# Patient Record
Sex: Male | Born: 1950 | Race: Black or African American | Hispanic: No | Smoking: Former smoker
Health system: Southern US, Community
[De-identification: ages and names within clinical notes are randomized; demographics above are authoritative.]

## PROBLEM LIST (undated history)

## (undated) DIAGNOSIS — E119 Type 2 diabetes mellitus without complications: Secondary | ICD-10-CM

## (undated) DIAGNOSIS — K759 Inflammatory liver disease, unspecified: Secondary | ICD-10-CM

## (undated) DIAGNOSIS — I10 Essential (primary) hypertension: Secondary | ICD-10-CM

## (undated) DIAGNOSIS — T148XXA Other injury of unspecified body region, initial encounter: Secondary | ICD-10-CM

## (undated) DIAGNOSIS — G4730 Sleep apnea, unspecified: Secondary | ICD-10-CM

## (undated) HISTORY — DX: Type 2 diabetes mellitus without complications: E11.9

## (undated) HISTORY — DX: Essential (primary) hypertension: I10

## (undated) HISTORY — DX: Inflammatory liver disease, unspecified: K75.9

## (undated) HISTORY — DX: Other injury of unspecified body region, initial encounter: T14.8XXA

## (undated) HISTORY — DX: Sleep apnea, unspecified: G47.30

---

## 1975-01-31 HISTORY — PX: KNEE SURGERY: SHX244

## 1980-01-31 HISTORY — PX: ABDOMINAL SURGERY: SHX537

## 2003-04-20 ENCOUNTER — Emergency Department (HOSPITAL_COMMUNITY): Admission: EM | Admit: 2003-04-20 | Discharge: 2003-04-20 | Payer: Self-pay | Admitting: Emergency Medicine

## 2006-04-19 ENCOUNTER — Ambulatory Visit (HOSPITAL_COMMUNITY): Admission: RE | Admit: 2006-04-19 | Discharge: 2006-04-19 | Payer: Self-pay | Admitting: Family Medicine

## 2006-10-05 ENCOUNTER — Ambulatory Visit (HOSPITAL_COMMUNITY): Admission: RE | Admit: 2006-10-05 | Discharge: 2006-10-05 | Payer: Self-pay | Admitting: Neurosurgery

## 2006-10-17 ENCOUNTER — Encounter: Admission: RE | Admit: 2006-10-17 | Discharge: 2006-10-17 | Payer: Self-pay | Admitting: Neurosurgery

## 2006-11-09 ENCOUNTER — Inpatient Hospital Stay (HOSPITAL_COMMUNITY): Admission: RE | Admit: 2006-11-09 | Discharge: 2006-11-13 | Payer: Self-pay | Admitting: Neurosurgery

## 2007-06-13 ENCOUNTER — Ambulatory Visit (HOSPITAL_COMMUNITY): Admission: RE | Admit: 2007-06-13 | Discharge: 2007-06-13 | Payer: Self-pay | Admitting: Family Medicine

## 2007-06-27 ENCOUNTER — Ambulatory Visit (HOSPITAL_COMMUNITY): Admission: RE | Admit: 2007-06-27 | Discharge: 2007-06-27 | Payer: Self-pay | Admitting: Family Medicine

## 2007-11-28 ENCOUNTER — Ambulatory Visit (HOSPITAL_COMMUNITY): Admission: RE | Admit: 2007-11-28 | Discharge: 2007-11-28 | Payer: Self-pay | Admitting: Family Medicine

## 2008-01-31 HISTORY — PX: CERVICAL FUSION: SHX112

## 2010-06-14 NOTE — Discharge Summary (Signed)
Jason Oliver, Jason Oliver            ACCOUNT NO.:  [id number]   MEDICAL RECORD NO.:  [id number]          PATIENT TYPE:  INP   LOCATION:  3035                         FACILITY:  [facility]   PHYSICIAN:  Payton Doughty, M.D.      DATE OF BIRTH:  11/11/1950   DATE OF ADMISSION:  11/09/2006  DATE OF DISCHARGE:  11/13/2006                               DISCHARGE SUMMARY   ADMITTING DIAGNOSIS:  C2 os odontoideum with C1-C2 instability.   DISCHARGE DIAGNOSIS:  C2 os odontoideum with C1-C2 instability.   PROCEDURES:  C1-C2 posterior cervical fusion.   ATTENDING:  Dr. Venetia Maxon   COMPLICATIONS:  None.   DISCHARGE STATUS:  Alive and well.   This is a 60 year old, right-handed black gentleman whose history and  physical is recounted in the chart.  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  He was admitted after ascertaining normal  laboratory values, underwent posterior fusion with Songer cable and  posterolateral arthrodesis at C1-2.  Postoperatively, he has done well.  He has had some drainage from his neck which is diminishing.  He had one  episode of fever which is now resolved.  His strength is full, he is  __________ with his activities of daily living and he knows to wear his  SOMI brace.  He is being discharged home in the care of his family with  Percocet for pain and ciprofloxacin for skin coverage.           ______________________________  Payton Doughty, M.D.     MWR/MEDQ  D:  11/13/2006  T:  11/13/2006  Job:  [id number]

## 2010-06-14 NOTE — Op Note (Signed)
NAMETILDON, SILVERIA            ACCOUNT NO.:  [id number]   MEDICAL RECORD NO.:  [id number]          PATIENT TYPE:  INP   LOCATION:  3035                         FACILITY:  [facility]   PHYSICIAN:  Danae Orleans. Venetia Maxon, M.D.  DATE OF BIRTH:  1950/08/21   DATE OF PROCEDURE:  11/09/2006  DATE OF DISCHARGE:                               OPERATIVE REPORT   PREOPERATIVE DIAGNOSIS:  C2 fracture with C1-2 instability with spinal  cord injury and cervical myelopathy.   POSTOPERATIVE DIAGNOSIS:  C2 fracture with C1-2 instability with spinal  cord injury and cervical myelopathy.   PROCEDURE:  1. C1-2 posterior cervical fusion with bone allograft and Songer      cable.  2. Posterolateral arthrodesis, C1 through C2, using bone morphogenic      protein and bone graft extender.   SURGEON:  Danae Orleans. Venetia Maxon, M.D.   ASSISTANTS:  1. Payton Doughty, M.D.  2. Georgiann Cocker, R.N.   ANESTHESIA:  General endotracheal anesthesia.   ESTIMATED BLOOD LOSS:  Minimal.   COMPLICATIONS:  None.   DISPOSITION:  To Recovery.   INDICATIONS:  Abdur-Rahim Basu is a 60 year old man with a severe  cervical myelopathy with os odontoideum and C1-2 instability.  It was  elected to take him to surgery for posterior cervical fusion at C1-2.   PROCEDURE:  Mr. Illescas was brought to the operating room.  He was  intubated with his neck in neutral alignment using a glide scope and  then placed in a cervical collar and 3-pin fixation and turned carefully  onto a prone position on the operating table.  His neck was maintained  in a slightly extended alignment and immediately upon turning and  positioning him, a fluoroscopic radiograph was obtained, which  demonstrated that he had good alignment of his C1-2 junction without  anterior translation of C1.  Then his neck was then prepped and draped  in the usual sterile fashion.  The patient has an extremely large neck.  The area of planned incision was infiltrated with 0.25% Marcaine,  0.5%  lidocaine and 1:200,000 epinephrine.  Incision was made in the posterior  neck from the occiput to approximately the level of C5 and carried  through adipose tissue to expose the C1-2 interspace and the C2 spinous  process.  Using cerebellar retractors and staying in the avascular  midline plane, the laminae of C2 were widely exposed, as was the ring of  C1.  Using very careful dissection, the dura was exposed both above and  below the ring of C1 and using a Woodson elevator, the interval between  the ring of C1 and the dura was established.  Subsequently, an 0 Prolene  suture was placed and then the single arm Songer cable was brought  through under the ring of C1.  A patellar allograft wedge was reshaped  so to approximate the ring of C1 and also to hook around the upper  aspect of C2 and prior to placing the Songer cable, the ring of C1 was  extensively decorticated, as was the laminae and spinous  process/anterior aspect of C2.  After the patellar  graft had been  fashioned, it was placed and the Songer cable was looped around C2 and  locked into position after tensioning up to 20 inch-pounds.  The graft  appeared to be well positioned and had good approximation on C2.  Subsequently, the radiograph showed well aligned cervical spine and  positioning of the graft and cable.  Overlying the C1-2 facet joint,  bone was then exposed and the dorsal bone was then decorticated and a  medium kit of BMP was used with 10 mL Mastergraft Matrix and this was  placed over the dorsolateral bone and facet joints.  The self-retaining  retractor was removed.  Prior to placing the BMP, the wound had been  irrigated.  The muscles were then reapproximated with 0 Vicryl sutures,  then the fascia reapproximated with 0 Vicryl sutures, the skin edges  were approximated with 2-0 Vicryl interrupted inverted sutures and then  staples.  A sterile occlusive dressing was placed.  The patient was  placed back  into an Aspen collar and returned to the supine position on  the OR stretcher.  He was taken out of 3-pin head fixation and taken to  Recovery, having tolerated the procedure well.      Danae Orleans. Venetia Maxon, M.D.  Electronically Signed     JDS/MEDQ  D:  11/09/2006  T:  11/10/2006  Job:  [id number]

## 2010-11-10 LAB — COMPREHENSIVE METABOLIC PANEL
AST: 27
CO2: 30
Chloride: 103
Creatinine, Ser: 0.89
GFR calc Af Amer: >60
GFR calc non Af Amer: >60
Potassium: 4.8
Sodium: 140
Total Bilirubin: 0.9
Total Protein: 7.1

## 2010-11-10 LAB — CBC
Platelets: 256
WBC: 5

## 2016-06-12 LAB — HEPATITIS B SURFACE ANTIGEN: Hepatitis B Surface Ag: NONREACTIVE

## 2016-06-13 LAB — HM HEPATITIS C SCREENING LAB: HM Hepatitis Screen: NEGATIVE

## 2017-05-22 ENCOUNTER — Other Ambulatory Visit (HOSPITAL_COMMUNITY): Payer: Self-pay | Admitting: Family Medicine

## 2017-05-22 ENCOUNTER — Ambulatory Visit (HOSPITAL_COMMUNITY)
Admission: RE | Admit: 2017-05-22 | Discharge: 2017-05-22 | Disposition: A | Discharge status: Home / Self Care | Payer: Medicare Other | Source: Ambulatory Visit | Attending: Family Medicine | Admitting: Family Medicine

## 2017-05-22 DIAGNOSIS — R2689 Other abnormalities of gait and mobility: Secondary | ICD-10-CM | POA: Insufficient documentation

## 2017-05-22 DIAGNOSIS — M25462 Effusion, left knee: Secondary | ICD-10-CM | POA: Insufficient documentation

## 2017-05-22 DIAGNOSIS — M170 Bilateral primary osteoarthritis of knee: Secondary | ICD-10-CM | POA: Diagnosis not present

## 2017-07-04 NOTE — Congregational Nurse Program (Cosign Needed)
Client request BP today, is on medication but states doesn't take as prescribed.  Encouraged client important to take as prescribed to keep BP under control. Client has a PCP. Pearletha AlfredJan Johara Lodwick,RN  [phone].

## 2017-09-06 ENCOUNTER — Ambulatory Visit (INDEPENDENT_AMBULATORY_CARE_PROVIDER_SITE_OTHER): Payer: Medicare Other | Admitting: Orthopaedic Surgery

## 2017-09-06 ENCOUNTER — Encounter: Payer: Self-pay | Admitting: Orthopaedic Surgery

## 2017-09-06 VITALS — BP 130/80 | HR 70 | Ht 64.0 in | Wt 252.0 lb

## 2017-09-06 DIAGNOSIS — G8929 Other chronic pain: Secondary | ICD-10-CM | POA: Diagnosis not present

## 2017-09-06 DIAGNOSIS — M25561 Pain in right knee: Secondary | ICD-10-CM

## 2017-09-06 DIAGNOSIS — Z6841 Body Mass Index (BMI) 40.0 and over, adult: Secondary | ICD-10-CM

## 2017-09-06 DIAGNOSIS — M25562 Pain in left knee: Secondary | ICD-10-CM | POA: Diagnosis not present
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
## 2018-02-26 ENCOUNTER — Other Ambulatory Visit (HOSPITAL_BASED_OUTPATIENT_CLINIC_OR_DEPARTMENT_OTHER): Payer: Self-pay

## 2018-02-26 DIAGNOSIS — G4733 Obstructive sleep apnea (adult) (pediatric): Secondary | ICD-10-CM

## 2018-02-27 ENCOUNTER — Ambulatory Visit: Payer: Medicare Other | Attending: Neurology | Admitting: Neurology

## 2018-02-27 DIAGNOSIS — G4733 Obstructive sleep apnea (adult) (pediatric): Secondary | ICD-10-CM | POA: Diagnosis present

## 2018-03-13 NOTE — Procedures (Signed)
[place] A. Merlene Laughter, MD     [email or web address]             NOCTURNAL POLYSOMNOGRAPHY   LOCATION: [facility]  Patient Name: Jason Oliver, Jason Oliver Date: 02/27/2018 Gender: Male D.O.B: [birth year] Age (years): 68 Referring Provider: Barton Fanny NP Height (inches): 66 Interpreting Physician: Phillips Odor MD, ABSM Weight (lbs): 266 RPSGT: Peak, Robert BMI: 43 MRN: [id number] Neck Size: 20.00 CLINICAL INFORMATION The patient is referred for a split night study with BPAP.  MEDICATIONS Medications self-administered by patient taken the night of the study : N/A  Current Outpatient Medications:  .  amLODipine (NORVASC) 10 MG tablet, Take 10 mg by mouth daily., Disp: , Rfl:  .  Cholecalciferol (VITAMIN D3 PO), Take 5,000 Units by mouth daily., Disp: , Rfl:  .  olmesartan (BENICAR) 20 MG tablet, Take 10 mg by mouth daily., Disp: , Rfl:   SLEEP STUDY TECHNIQUE As per the AASM Manual for the Scoring of Sleep and Associated Events v2.3 (April 2016) with a hypopnea requiring 4% desaturations.  The channels recorded and monitored were frontal, central and occipital EEG, electrooculogram (EOG), submentalis EMG (chin), nasal and oral airflow, thoracic and abdominal wall motion, anterior tibialis EMG, snore microphone, electrocardiogram, and pulse oximetry. Bi-level positive airway pressure (BiPAP) was initiated when the patient met split night criteria and was titrated according to treat sleep-disordered breathing.  RESPIRATORY PARAMETERS Diagnostic  Total AHI (/hr): 62.6 RDI (/hr): 76.9 OA Index (/hr): 3.8 CA Index (/hr): 3.8 REM AHI (/hr): 90.0 NREM AHI (/hr): 60.8 Supine AHI (/hr): 70.0 Non-supine AHI (/hr): 27.69 Min O2 Sat (%): 79.0 Mean O2 (%): 93.0 Time below 88% (min): 5.3   Titration  Optimal IPAP Pressure (cm):  Optimal EPAP Pressure (cm):  AHI at Optimal Pressure (/hr): N/A Min O2 at Optimal Pressure (%): 89.0 Sleep % at Optimal (%): N/A Supine % at  Optimal (%): N/A   The patient was titrated on conventional CPAP between pressures of 5 to 14 unsuccessfully.  This resulted in the emergence of severe central sleep apnea syndrome also unresponsive to bilevel pressures.  Pressures tried were 12/8 to 16/12.     The diagnostic obstructive AHI is 58.4 and central 4.1 but with positive pressure treatment and the development of central sleep apnea syndrome the treatment  AHI for obstructive is 22 and central 48. The total events combined for  Obstructive events are 234 and central events  175.     SLEEP ARCHITECTURE The study was initiated at 9:03:26 PM and terminated at 4:35:26 AM. The total recorded time was 452 minutes. EEG confirmed total sleep time was 364.8 minutes yielding a sleep efficiency of 80.7%%. Sleep onset after lights out was 7.7 minutes with a REM latency of 156.0 minutes. The patient spent 24.5%% of the night in stage N1 sleep, 62.2%% in stage N2 sleep, 0.0%% in stage N3 and 13.3% in REM. Wake after sleep onset (WASO) was 79.5 minutes. The Arousal Index was 15.0/hour.     LEG MOVEMENT DATA The total Periodic Limb Movements of Sleep (PLMS) were 0. The PLMS index was 0.0 .  CARDIAC DATA The 2 lead EKG demonstrated sinus rhythm. The mean heart rate was 100.0 beats per minute. Other EKG findings include: None.  IMPRESSIONS 1. Severe obstructive sleep apnea occurred during the diagnostic portion of the study (AHI = 62.6 /hour). An optimal CPAP/BPAP pressure could not be selected for this patient based on the available study data.  Treatment with positive pressure resulted in  the emergence of severe central sleep apnea.  A formal positive titration study is recommended with the likely need for Servo ventilation.    Delano Metz, MD Diplomate, American Board of Sleep Medicine. ELECTRONICALLY SIGNED ON:  03/13/2018, 11:04 AM [facility] PH: [phone]   FX: [phone] [place]

## 2018-04-04 ENCOUNTER — Other Ambulatory Visit (HOSPITAL_BASED_OUTPATIENT_CLINIC_OR_DEPARTMENT_OTHER): Payer: Self-pay

## 2018-04-04 DIAGNOSIS — G4733 Obstructive sleep apnea (adult) (pediatric): Secondary | ICD-10-CM

## 2018-04-12 ENCOUNTER — Other Ambulatory Visit: Payer: Self-pay

## 2018-04-12 ENCOUNTER — Ambulatory Visit: Payer: Medicare Other | Attending: Neurology | Admitting: Neurology

## 2018-04-12 DIAGNOSIS — Z79899 Other long term (current) drug therapy: Secondary | ICD-10-CM | POA: Insufficient documentation

## 2018-04-12 DIAGNOSIS — G4733 Obstructive sleep apnea (adult) (pediatric): Secondary | ICD-10-CM | POA: Insufficient documentation

## 2018-04-16 NOTE — Procedures (Signed)
  [facility] Antuan Limes A. Gerilyn Pilgrim, MD     [email or web address]             NOCTURNAL POLYSOMNOGRAPHY   LOCATION: [facility]   Patient Name: Jason Oliver, Signs Date: 04/12/2018 Gender: Male D.O.B: 1950/02/13 Age (years): 44 Referring Provider: Jorge Mandril NP Height (inches): 66 Interpreting Physician: Beryle Beams MD, ABSM Weight (lbs): 266 RPSGT: Peak, Robert BMI: 43 MRN: [id number] Neck Size: 20.00 CLINICAL INFORMATION The patient is referred for a BiPAP titration to treat sleep apnea.  Date of NPSG, Split Night or HST:  SLEEP STUDY TECHNIQUE As per the AASM Manual for the Scoring of Sleep and Associated Events v2.3 (April 2016) with a hypopnea requiring 4% desaturations.  The channels recorded and monitored were frontal, central and occipital EEG, electrooculogram (EOG), submentalis EMG (chin), nasal and oral airflow, thoracic and abdominal wall motion, anterior tibialis EMG, snore microphone, electrocardiogram, and pulse oximetry. Bilevel positive airway pressure (BPAP) was initiated at the beginning of the study and titrated to treat sleep-disordered breathing.  MEDICATIONS Medications self-administered by patient taken the night of the study : N/A  Current Outpatient Medications:  .  amLODipine (NORVASC) 10 MG tablet, Take 10 mg by mouth daily., Disp: , Rfl:  .  Cholecalciferol (VITAMIN D3 PO), Take 5,000 Units by mouth daily., Disp: , Rfl:  .  olmesartan (BENICAR) 20 MG tablet, Take 10 mg by mouth daily., Disp: , Rfl:    RESPIRATORY PARAMETERS Optimal IPAP Pressure (cm): 14 AHI at Optimal Pressure (/hr) 6.5 Optimal EPAP Pressure (cm): 9   Overall Minimal O2 (%): 85.0 Minimal O2 at Optimal Pressure (%): 92.0  The patient was titrated on bilevel pressures between 8/4 up to 14/9.  The optimal pressure is a 14/9.   SLEEP ARCHITECTURE Start Time: 10:42:03 PM Stop Time: 5:03:15 AM Total Time (min): 381.2 Total Sleep Time (min): 261.4 Sleep Latency (min):  6.3 Sleep Efficiency (%): 68.6% REM Latency (min): 168.0 WASO (min): 113.5 Stage N1 (%): 26.8% Stage N2 (%): 62.1% Stage N3 (%): 0.0% Stage R (%): 11.1 Supine (%): 69.37 Arousal Index (/hr): 22.7      CARDIAC DATA The 2 lead EKG demonstrated sinus rhythm. The mean heart rate was 59.6 beats per minute. Other EKG findings include: None.  LEG MOVEMENT DATA The total Periodic Limb Movements of Sleep (PLMS) were 0. The PLMS index was 0.0. A PLMS index of <15 is considered normal in adults.  IMPRESSIONS The optimal BIPAP selected for this patient are ( 14 / 9cm of water).  Argie Ramming, MD Diplomate, American Board of Sleep Medicine.  ELECTRONICALLY SIGNED ON:  04/16/2018, 6:07 PM [facility] PH: [phone]   FX: [phone] ACCREDITED BY THE AMERICAN ACADEMY OF SLEEP MEDICINE

## 2018-05-26 ENCOUNTER — Encounter: Payer: Self-pay | Admitting: Orthopaedic Surgery

## 2019-01-27 IMAGING — DX DG KNEE COMPLETE 4+V*L*
4 series · 4 of 4 positions shown · non-contrast
Comparison: 11/28/2007

CLINICAL DATA: Bilateral knee discomfort.

EXAM:
LEFT KNEE - COMPLETE 4+ VIEW

[knee ap]
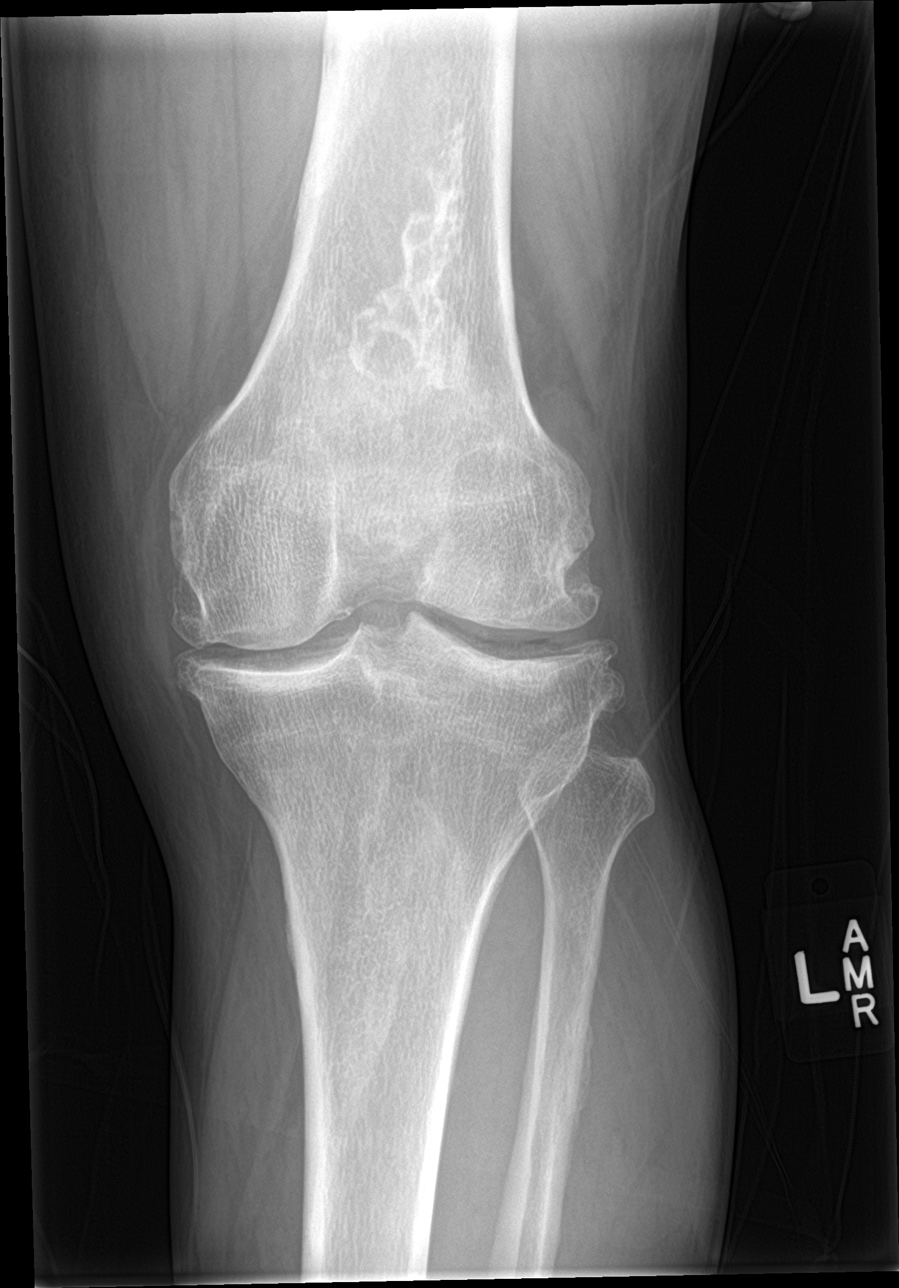

[tunnel]
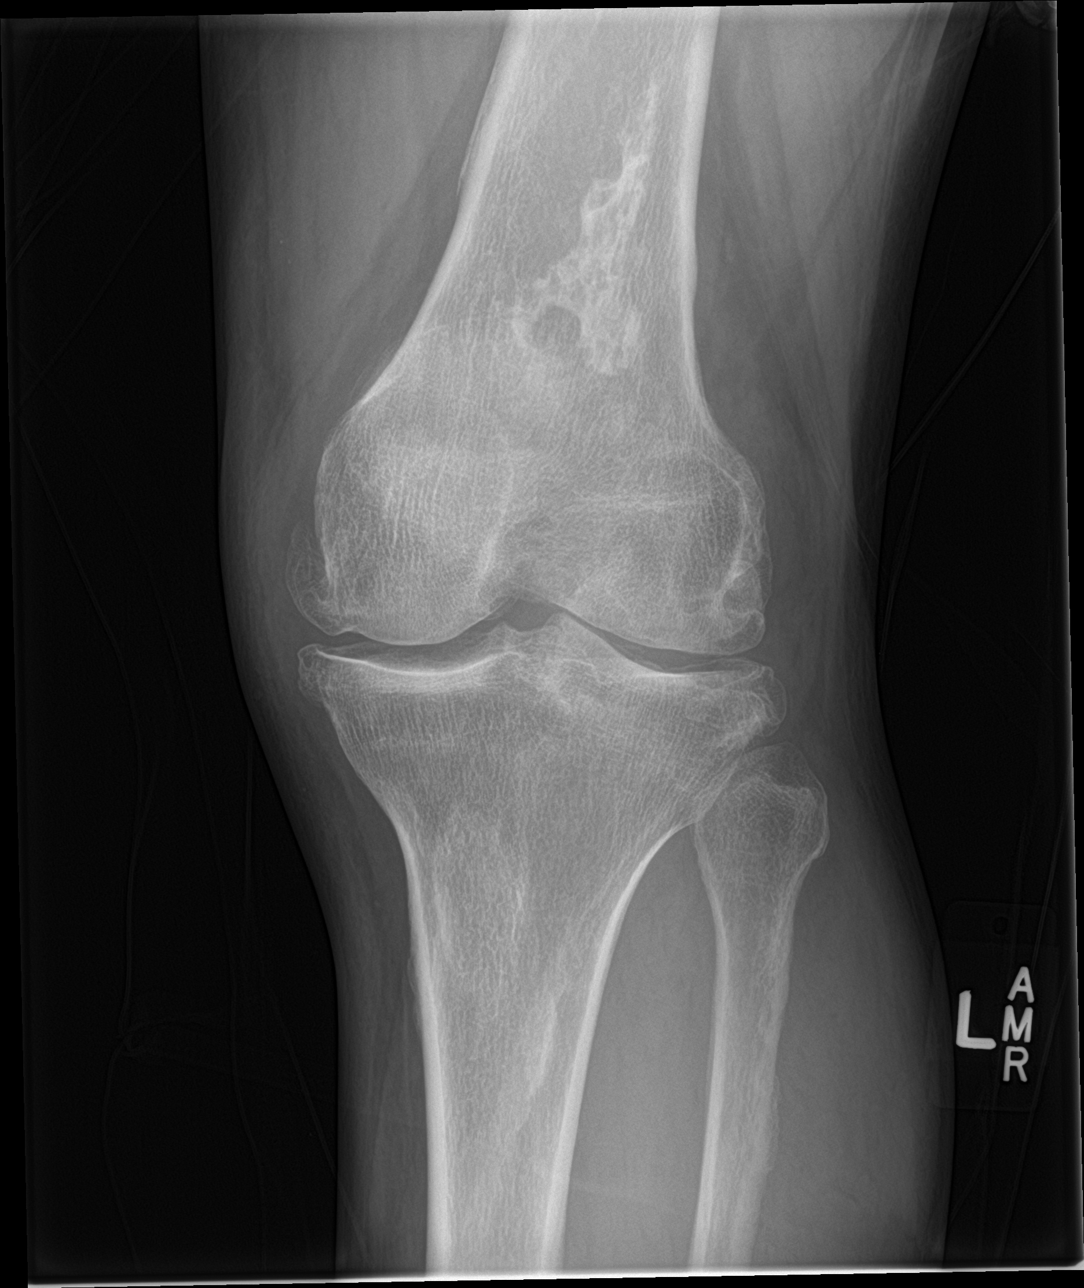

[knee lat]
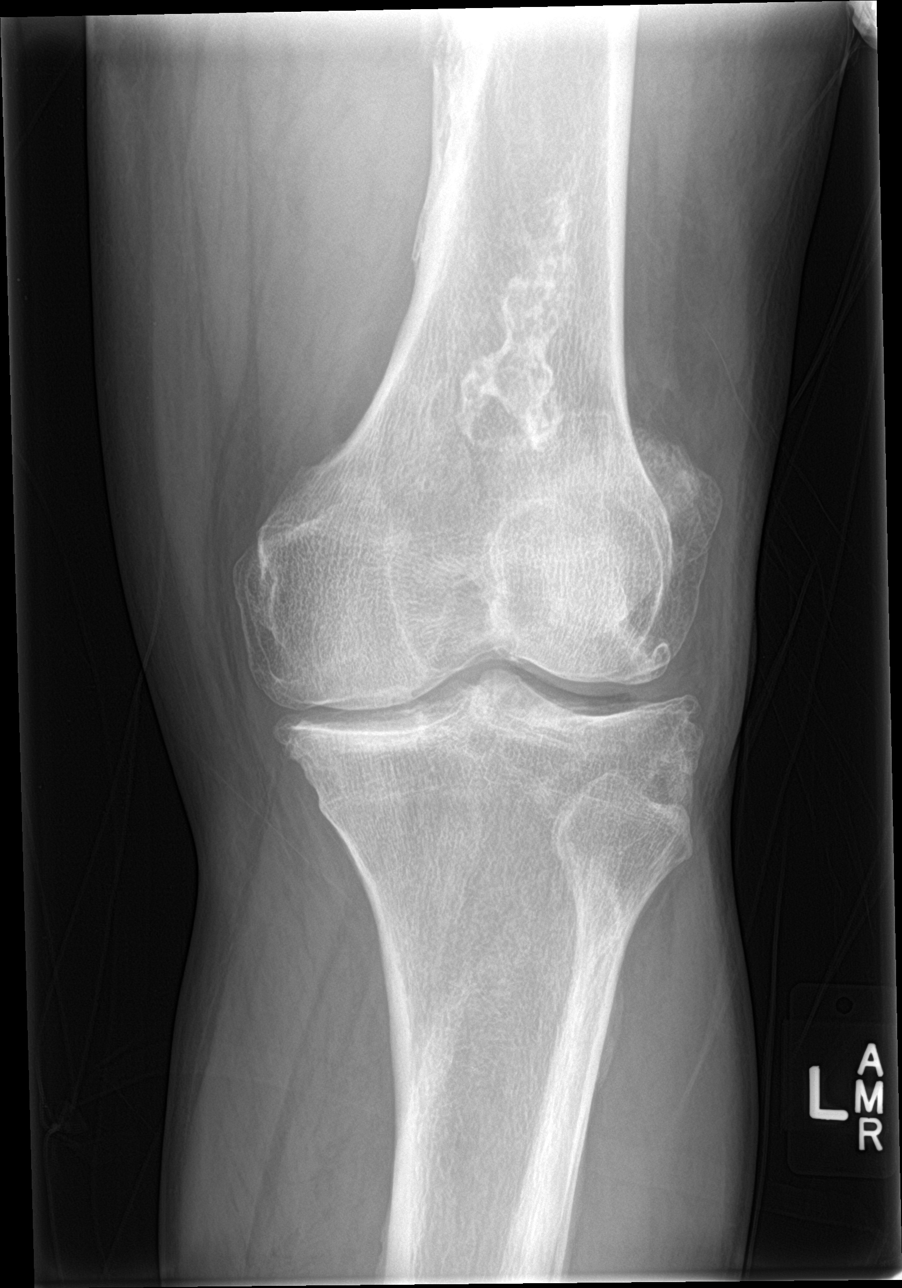

[knee sunrise]
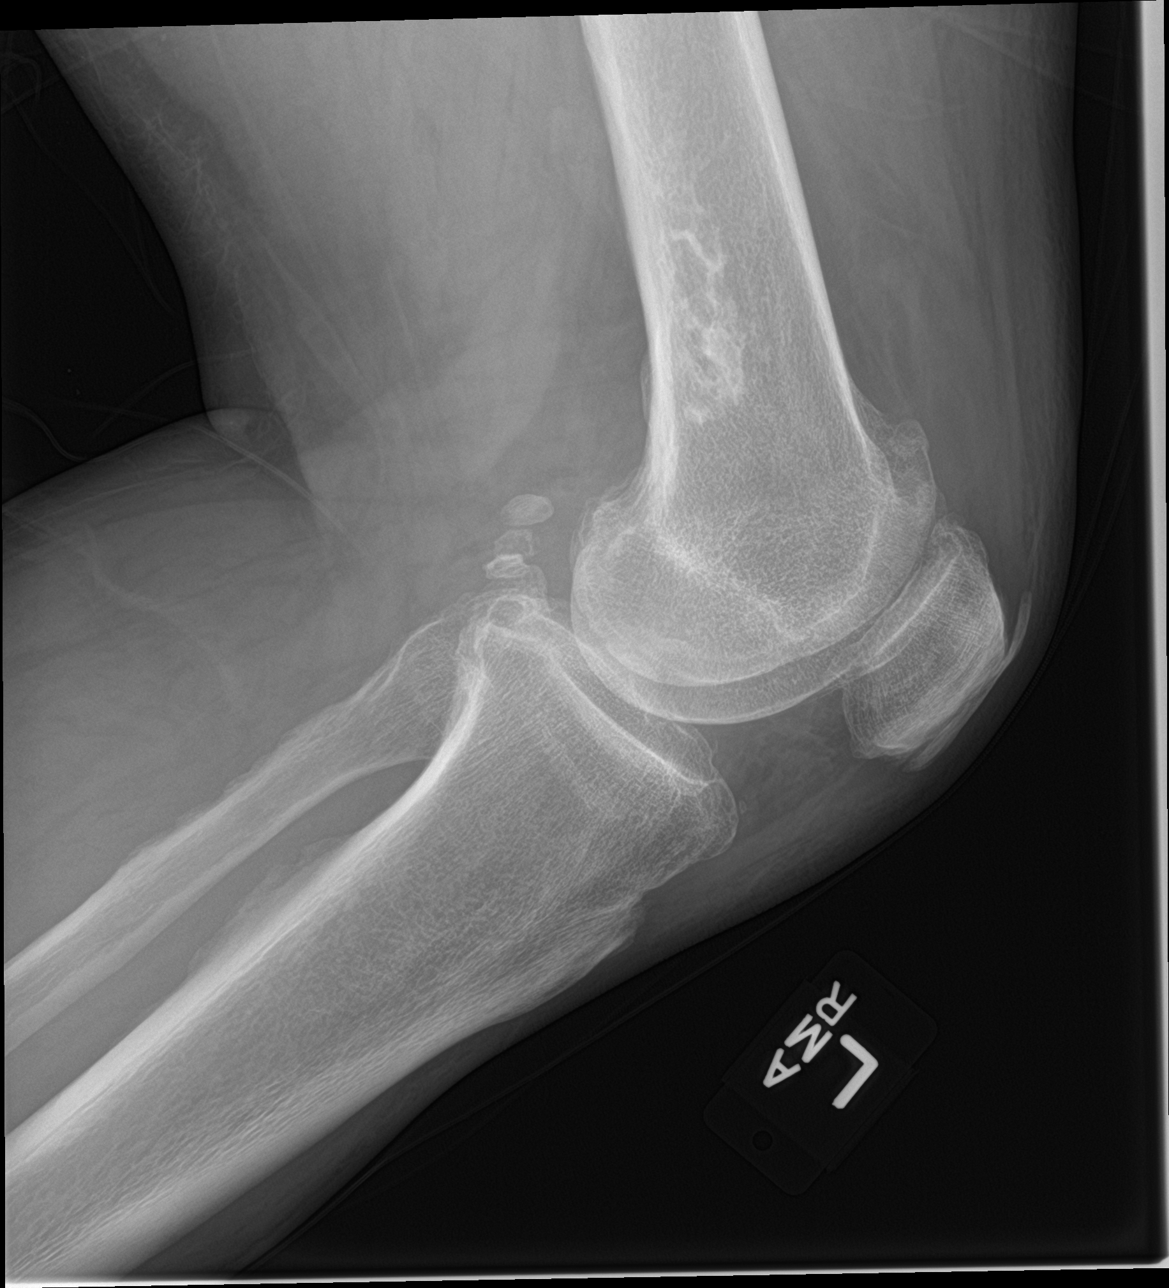

[4 of 4 positions shown; findings below may reference images not displayed]

FINDINGS: Chronic tricompartmental osteoarthritis with joint space narrowing
and marginal osteophytes, slightly progressive over the last 10
years. Moderate size knee joint effusion. Intra-articular loose
bodies, more numerous than seen previously. Chronic medullary bone
infarction of the distal femur and old traumatic changes of the
femoral diaphysis.
IMPRESSION: Chronic tricompartmental osteoarthritis, worsening over time, with a
moderate effusion and more loose bodies.

## 2019-10-15 LAB — LIPID PANEL
Cholesterol: 179 (ref 0–200)
HDL: 35 (ref 35–70)
LDL Cholesterol: 119
LDl/HDL Ratio: 5.1
Triglycerides: 135 (ref 40–160)

## 2019-10-15 LAB — HEPATIC FUNCTION PANEL
ALT: 72 — AB (ref 10–40)
AST: 33 (ref 14–40)
Alkaline Phosphatase: 57 (ref 25–125)
Bilirubin, Total: 0.6

## 2019-10-15 LAB — CBC AND DIFFERENTIAL
HCT: 40 — AB (ref 41–53)
Hemoglobin: 13.6 (ref 13.5–17.5)
Platelets: 214 (ref 150–399)
WBC: 4.8

## 2019-10-15 LAB — COMPREHENSIVE METABOLIC PANEL
Albumin: 4.5 (ref 3.5–5.0)
Calcium: 9.3 (ref 8.7–10.7)
GFR calc Af Amer: 97
Globulin: 2.8

## 2019-10-15 LAB — TSH: TSH: 1.67 (ref 0.41–5.90)

## 2019-10-15 LAB — BASIC METABOLIC PANEL
BUN: 10 (ref 4–21)
CO2: 28 — AB (ref 13–22)
Chloride: 105 (ref 99–108)
Creatinine: 0.9 (ref 0.6–1.3)
Glucose: 165
Potassium: 4.3 (ref 3.4–5.3)
Sodium: 138 (ref 137–147)

## 2019-10-15 LAB — CBC: RBC: 4.28 (ref 3.87–5.11)

## 2019-10-15 LAB — PSA: PSA: 1.53

## 2020-08-04 ENCOUNTER — Telehealth (INDEPENDENT_AMBULATORY_CARE_PROVIDER_SITE_OTHER): Payer: Self-pay

## 2020-08-04 NOTE — Telephone Encounter (Signed)
Called patient and scheduled him on 09/15/2020 at 1pm. Patient verbalized an understanding.

## 2020-08-04 NOTE — Telephone Encounter (Signed)
Patient called and is requesting a new patient appointment since Dr. Janna Arch is retiring. Patient denies chronic pain medication.  Please advise if OK to schedule for new patient appointment.

## 2020-08-16 LAB — HM DIABETES EYE EXAM

## 2020-09-15 ENCOUNTER — Ambulatory Visit (INDEPENDENT_AMBULATORY_CARE_PROVIDER_SITE_OTHER): Payer: Medicare Other | Admitting: Nurse Practitioner

## 2020-12-17 ENCOUNTER — Encounter (HOSPITAL_COMMUNITY): Payer: Self-pay | Admitting: *Deleted

## 2020-12-17 ENCOUNTER — Emergency Department (HOSPITAL_COMMUNITY)
Admission: EM | Admit: 2020-12-17 | Discharge: 2020-12-17 | Disposition: A | Discharge status: Home / Self Care | Payer: Medicare Other | Attending: Emergency Medicine | Admitting: Emergency Medicine

## 2020-12-17 ENCOUNTER — Other Ambulatory Visit: Payer: Self-pay

## 2020-12-17 DIAGNOSIS — I10 Essential (primary) hypertension: Secondary | ICD-10-CM | POA: Insufficient documentation

## 2020-12-17 DIAGNOSIS — Z79899 Other long term (current) drug therapy: Secondary | ICD-10-CM | POA: Insufficient documentation

## 2020-12-17 DIAGNOSIS — R350 Frequency of micturition: Secondary | ICD-10-CM | POA: Diagnosis not present

## 2020-12-17 DIAGNOSIS — R739 Hyperglycemia, unspecified: Secondary | ICD-10-CM | POA: Insufficient documentation

## 2020-12-17 DIAGNOSIS — Z87891 Personal history of nicotine dependence: Secondary | ICD-10-CM | POA: Diagnosis not present

## 2020-12-17 DIAGNOSIS — R300 Dysuria: Secondary | ICD-10-CM | POA: Insufficient documentation

## 2020-12-17 LAB — BASIC METABOLIC PANEL
Anion gap: 9 (ref 5–15)
BUN: 10 mg/dL (ref 8–23)
CO2: 26 mmol/L (ref 22–32)
Calcium: 9.5 mg/dL (ref 8.9–10.3)
Chloride: 100 mmol/L (ref 98–111)
Creatinine, Ser: 0.98 mg/dL (ref 0.61–1.24)
GFR, Estimated: >60 mL/min (ref >60)
Glucose, Bld: 238 mg/dL — ABNORMAL HIGH (ref 70–99)
Potassium: 4.1 mmol/L (ref 3.5–5.1)
Sodium: 135 mmol/L (ref 135–145)

## 2020-12-17 LAB — URINALYSIS, ROUTINE W REFLEX MICROSCOPIC
Bacteria, UA: NONE SEEN
Bilirubin Urine: NEGATIVE
Glucose, UA: >=500 mg/dL — AB
Hgb urine dipstick: NEGATIVE
Ketones, ur: 5 mg/dL — AB
Leukocytes,Ua: NEGATIVE
Nitrite: NEGATIVE
Protein, ur: NEGATIVE mg/dL
Specific Gravity, Urine: 1.025 (ref 1.005–1.030)
pH: 5 (ref 5.0–8.0)

## 2020-12-17 LAB — CBC
HCT: 39.9 % (ref 39.0–52.0)
Hemoglobin: 13.9 g/dL (ref 13.0–17.0)
MCH: 31.8 pg (ref 26.0–34.0)
MCHC: 34.8 g/dL (ref 30.0–36.0)
MCV: 91.3 fL (ref 80.0–100.0)
Platelets: 208 10*3/uL (ref 150–400)
RBC: 4.37 MIL/uL (ref 4.22–5.81)
RDW: 13.1 % (ref 11.5–15.5)
WBC: 5.5 10*3/uL (ref 4.0–10.5)
nRBC: 0 % (ref 0.0–0.2)

## 2020-12-17 LAB — HEMOGLOBIN A1C
Hgb A1c MFr Bld: 10.7 % — ABNORMAL HIGH (ref 4.8–5.6)
Mean Plasma Glucose: 260.39 mg/dL

## 2020-12-17 LAB — BETA-HYDROXYBUTYRIC ACID: Beta-Hydroxybutyric Acid: 0.14 mmol/L (ref 0.05–0.27)

## 2020-12-17 MED ORDER — METFORMIN HCL 500 MG PO TABS: 500.0000 mg | ORAL_TABLET | Freq: Two times a day (BID) | ORAL | 3 refills | Status: DC

## 2020-12-17 NOTE — ED Triage Notes (Signed)
Pt with urine frequency, states white coat on tongue. Denies any pain to tongue.  Denies any increase in being thirsty. Denies any pain with urination.

## 2020-12-17 NOTE — Discharge Instructions (Addendum)
You are diagnosed with high blood sugars today in your urine and on your blood test.  This could be consistent with new onset diabetes.  You will need to follow-up with your primary care doctor for this issue.  We did send out your A1c blood test, which will take a few days to resolve.  This is the best test to measure for diabetes.  In the meantime I started you on metformin, which is a pill that you can take for blood sugar control.  It is important you keep drinking plenty of water to prevent yourself from getting dehydrated.

## 2020-12-17 NOTE — ED Notes (Signed)
Pt has no complaint aside from increased urination and just wants to know urine test results. He denies any pain. Also states he is urinating "a normal amount since he has been here"
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
## 2021-01-19 ENCOUNTER — Ambulatory Visit (INDEPENDENT_AMBULATORY_CARE_PROVIDER_SITE_OTHER): Payer: Medicare Other | Admitting: Nurse Practitioner

## 2021-01-19 ENCOUNTER — Other Ambulatory Visit: Payer: Self-pay | Admitting: *Deleted

## 2021-01-19 ENCOUNTER — Encounter: Payer: Self-pay | Admitting: Nurse Practitioner

## 2021-01-19 ENCOUNTER — Encounter (INDEPENDENT_AMBULATORY_CARE_PROVIDER_SITE_OTHER): Payer: Self-pay

## 2021-01-19 ENCOUNTER — Other Ambulatory Visit: Payer: Self-pay

## 2021-01-19 VITALS — BP 189/107 | HR 82 | Ht 66.0 in | Wt 253.0 lb

## 2021-01-19 DIAGNOSIS — Z1211 Encounter for screening for malignant neoplasm of colon: Secondary | ICD-10-CM | POA: Diagnosis not present

## 2021-01-19 DIAGNOSIS — E1165 Type 2 diabetes mellitus with hyperglycemia: Secondary | ICD-10-CM | POA: Diagnosis not present

## 2021-01-19 DIAGNOSIS — I10 Essential (primary) hypertension: Secondary | ICD-10-CM | POA: Diagnosis not present

## 2021-01-19 MED ORDER — LABETALOL HCL 100 MG PO TABS: 100.0000 mg | ORAL_TABLET | Freq: Two times a day (BID) | ORAL | 3 refills | Status: DC

## 2021-01-19 MED ORDER — BLOOD PRESSURE MONITOR DEVI: 0 refills | Status: DC

## 2021-01-19 MED ORDER — AMLODIPINE-OLMESARTAN 10-40 MG PO TABS: 1.0000 | ORAL_TABLET | Freq: Every day | ORAL | 0 refills | Status: DC

## 2021-01-19 MED ORDER — METFORMIN HCL 500 MG PO TABS: 1000.0000 mg | ORAL_TABLET | Freq: Two times a day (BID) | ORAL | 3 refills | Status: DC

## 2021-01-19 NOTE — Patient Instructions (Addendum)
Start taking amlodipine-olmesartan 10-40 mg daily for your blood pressure.  Stop taking olmesartan 40 mg.  Continue to take your other blood pressure pills as well. Please check your blood pressure three times a week write down the readings and bring to your follow up appointment.   start taking metformin 1000 mg two times daily with meals   Please get your blood work done today.     It is important that you exercise regularly at least 30 minutes 5 times a week.  Think about what you will eat, plan ahead. Choose " clean, green, fresh or frozen" over canned, processed or packaged foods which are more sugary, salty and fatty. 70 to 75% of food eaten should be vegetables and fruit. Three meals at set times with snacks allowed between meals, but they must be fruit or vegetables. Aim to eat over a 12 hour period , example 7 am to 7 pm, and STOP after  your last meal of the day. Drink water,generally about 64 ounces per day, no other drink is as healthy. Fruit juice is best enjoyed in a healthy way, by EATING the fruit.  Thanks for choosing [facility], we consider it a privelige to serve you.

## 2021-01-19 NOTE — Progress Notes (Signed)
° °  Primo Innis     MRN: [id number]      DOB: Jul 19, 1950   HPI Mr. Reaume is here to establish care. Previous pt of [facility].  Sees neurology for sleep apnea and neck problems, next appointment is in April. Does not know his last wellness exam date.  Hypertension ; takes olmesartan 40 mg daily,  labetalol 100mg  daily, clonidine 0.2mg  daily  Diabetes; takes metformin 500 mg BID   OSA; wears CPAP at home   Chronic bilateral knee pain; See othopedics  Pt refused all vaccinations due, pt educated on the need to get required vaccinations he verbalized understanding.  ROS Denies recent fever or chills. Denies sinus pressure, nasal congestion, ear pain or sore throat. Denies chest congestion, productive cough or wheezing. Denies chest pains, palpitations and leg swelling Denies abdominal pain, nausea, vomiting,diarrhea or constipation.   Denies dysuria, frequency, hesitancy or incontinence. Denies joint pain, swelling and limitation in mobility. Denies headaches, seizures, numbness, or tingling. Denies depression, anxiety or insomnia. Denies skin break down or rash.   PE  BP (!) 189/107 (BP Location: Right Arm, Patient Position: Sitting, Cuff Size: Large)    Pulse 82    Ht 5\' 6"  (1.676 m)    Wt 253 lb (114.8 kg)    SpO2 98%    BMI 40.84 kg/m   Patient alert and oriented and in no cardiopulmonary distress.  HEENT: No facial asymmetry, EOMI,     Neck supple .  Chest: Clear to auscultation bilaterally.  CVS: S1, S2 no murmurs, no S3.Regular rate.  ABD: Soft non tender.   Ext: No edema  MS: Adequate ROM spine, shoulders, hips and knees.  Skin: Intact, no ulcerations or rash noted.  Psych: Good eye contact, normal affect. Memory intact not anxious or depressed appearing.  CNS: CN 2-12 intact, power,  normal throughout.no focal deficits noted.   Assessment & Plan

## 2021-01-20 ENCOUNTER — Other Ambulatory Visit: Payer: Self-pay | Admitting: Nurse Practitioner

## 2021-01-20 DIAGNOSIS — E785 Hyperlipidemia, unspecified: Secondary | ICD-10-CM

## 2021-01-20 LAB — CMP14+EGFR
ALT: 63 IU/L — ABNORMAL HIGH (ref 0–44)
AST: 24 IU/L (ref 0–40)
Albumin/Globulin Ratio: 1.8 (ref 1.2–2.2)
Albumin: 4.9 g/dL — ABNORMAL HIGH (ref 3.8–4.8)
Alkaline Phosphatase: 66 IU/L (ref 44–121)
BUN/Creatinine Ratio: 8 — ABNORMAL LOW (ref 10–24)
BUN: 8 mg/dL (ref 8–27)
Bilirubin Total: 0.5 mg/dL (ref 0.0–1.2)
CO2: 24 mmol/L (ref 20–29)
Calcium: 10.1 mg/dL (ref 8.6–10.2)
Chloride: 101 mmol/L (ref 96–106)
Creatinine, Ser: 0.97 mg/dL (ref 0.76–1.27)
Globulin, Total: 2.7 g/dL (ref 1.5–4.5)
Glucose: 123 mg/dL — ABNORMAL HIGH (ref 70–99)
Potassium: 4.7 mmol/L (ref 3.5–5.2)
Sodium: 140 mmol/L (ref 134–144)
Total Protein: 7.6 g/dL (ref 6.0–8.5)
eGFR: 85 mL/min/{1.73_m2} (ref >59)

## 2021-01-20 LAB — LIPID PANEL
Chol/HDL Ratio: 5.8 ratio — ABNORMAL HIGH (ref 0.0–5.0)
Cholesterol, Total: 207 mg/dL — ABNORMAL HIGH (ref 100–199)
HDL: 36 mg/dL — ABNORMAL LOW (ref >39)
LDL Chol Calc (NIH): 140 mg/dL — ABNORMAL HIGH (ref 0–99)
Triglycerides: 173 mg/dL — ABNORMAL HIGH (ref 0–149)
VLDL Cholesterol Cal: 31 mg/dL (ref 5–40)

## 2021-01-20 MED ORDER — ATORVASTATIN CALCIUM 20 MG PO TABS: 20.0000 mg | ORAL_TABLET | Freq: Every day | ORAL | 3 refills | Status: DC

## 2021-01-21 ENCOUNTER — Encounter: Payer: Self-pay | Admitting: Nurse Practitioner

## 2021-01-21 NOTE — Assessment & Plan Note (Signed)
Refused colonoscopy, cologuard ordered.

## 2021-01-21 NOTE — Assessment & Plan Note (Signed)
Lab Results  Component Value Date   HGBA1C 10.7 (H) 12/17/2020  metformin increased to 1000 mg BID. Refer to CCM for med management and assistance with med. Refer to Diabetes education class. Need to avoid sugars, sweets , be on carb modified diet and regular vigorous exercise 30 minutes 5 times a week discussed with pt.

## 2021-01-21 NOTE — Assessment & Plan Note (Signed)
Importance of healthy food choices with portion control discussed as well as eating regularly within 12  hour window.   The need to choose clean green food 50%-75% of time is discussed as well as make water the primary drink and set a goal for 64 ounces daily.  Patient reeducated about the importance of committment to minimum of 150 minutes of exercise per week.  Three meals at set times with snacks allowed between meals but they must be fruit or vegetable.   Aim to eat  over 12 hour period  for example 7 am to 7 pm. Stop after your last meal of the day.  Wt Readings from Last 3 Encounters:  01/19/21 253 lb (114.8 kg)  12/17/20 255 lb (115.7 kg)  09/06/17 252 lb (114.3 kg)

## 2021-01-21 NOTE — Assessment & Plan Note (Addendum)
DASH diet and commitment to daily physical activity for a minimum of 30 minutes discussed and encouraged, as a part of hypertension management. The importance of attaining a healthy weight is also discussed.  BP/Weight 01/19/2021 12/17/2020 09/06/2017 07/04/2017  Systolic BP 189 179 130 150  Diastolic BP 107 95 80 85  Wt. (Lbs) 253 255 252 -  BMI 40.84 43.77 43.26 -  start amlodipine-olmersatan 10-40 mg daily,  Continue catapres 0.2mg  at bedtime, labetalol 100mg  daily BP monitor ordered, recheck BP in 2 weeks

## 2021-01-26 ENCOUNTER — Ambulatory Visit (INDEPENDENT_AMBULATORY_CARE_PROVIDER_SITE_OTHER): Payer: Medicare Other

## 2021-01-26 ENCOUNTER — Other Ambulatory Visit: Payer: Self-pay

## 2021-01-26 ENCOUNTER — Telehealth: Payer: Self-pay | Admitting: *Deleted

## 2021-01-26 DIAGNOSIS — Z0000 Encounter for general adult medical examination without abnormal findings: Secondary | ICD-10-CM

## 2021-01-26 NOTE — Patient Instructions (Addendum)
Jason Oliver , Thank you for taking time to come for your Medicare Wellness Visit. I appreciate your ongoing commitment to your health goals. Please review the following plan we discussed and let me know if I can assist you in the future.   These are the goals we discussed:  Goals   None     This is a list of the screening recommended for you and due dates:  Health Maintenance  Topic Date Due   COVID-19 Vaccine (1) Never done   Complete foot exam   Never done   Eye exam for diabetics  Never done   Tetanus Vaccine  Never done   Zoster (Shingles) Vaccine (1 of 2) 04/19/2021*   Flu Shot  04/29/2021*   Pneumonia Vaccine (1 - PCV) 01/19/2022*   Colon Cancer Screening  01/19/2022*   Hemoglobin A1C  06/16/2021   Hepatitis C Screening: USPSTF Recommendation to screen - Ages 18-79 yo.  Completed   HPV Vaccine  Aged Out  *Topic was postponed. The date shown is not the original due date.    Health Maintenance, Male Adopting a healthy lifestyle and getting preventive care are important in promoting health and wellness. Ask your health care provider about: The right schedule for you to have regular tests and exams. Things you can do on your own to prevent diseases and keep yourself healthy. What should I know about diet, weight, and exercise? Eat a healthy diet  Eat a diet that includes plenty of vegetables, fruits, low-fat dairy products, and lean protein. Do not eat a lot of foods that are high in solid fats, added sugars, or sodium. Maintain a healthy weight Body mass index (BMI) is a measurement that can be used to identify possible weight problems. It estimates body fat based on height and weight. Your health care provider can help determine your BMI and help you achieve or maintain a healthy weight. Get regular exercise Get regular exercise. This is one of the most important things you can do for your health. Most adults should: Exercise for at least 150 minutes each week. The exercise  should increase your heart rate and make you sweat (moderate-intensity exercise). Do strengthening exercises at least twice a week. This is in addition to the moderate-intensity exercise. Spend less time sitting. Even light physical activity can be beneficial. Watch cholesterol and blood lipids Have your blood tested for lipids and cholesterol at 68 years of age, then have this test every 5 years. You may need to have your cholesterol levels checked more often if: Your lipid or cholesterol levels are high. You are older than 68 years of age. You are at high risk for heart disease. What should I know about cancer screening? Many types of cancers can be detected early and may often be prevented. Depending on your health history and family history, you may need to have cancer screening at various ages. This may include screening for: Colorectal cancer. Prostate cancer. Skin cancer. Lung cancer. What should I know about heart disease, diabetes, and high blood pressure? Blood pressure and heart disease High blood pressure causes heart disease and increases the risk of stroke. This is more likely to develop in people who have high blood pressure readings or are overweight. Talk with your health care provider about your target blood pressure readings. Have your blood pressure checked: Every 3-5 years if you are 62-59 years of age. Every year if you are 42 years old or older. If you are between the  ages of 12 and 44 and are a current or former smoker, ask your health care provider if you should have a one-time screening for abdominal aortic aneurysm (AAA). Diabetes Have regular diabetes screenings. This checks your fasting blood sugar level. Have the screening done: Once every three years after age 20 if you are at a normal weight and have a low risk for diabetes. More often and at a younger age if you are overweight or have a high risk for diabetes. What should I know about preventing  infection? Hepatitis B If you have a higher risk for hepatitis B, you should be screened for this virus. Talk with your health care provider to find out if you are at risk for hepatitis B infection. Hepatitis C Blood testing is recommended for: Everyone born from [birth year] through 1963. Anyone with known risk factors for hepatitis C. Sexually transmitted infections (STIs) You should be screened each year for STIs, including gonorrhea and chlamydia, if: You are sexually active and are younger than 68 years of age. You are older than 68 years of age and your health care provider tells you that you are at risk for this type of infection. Your sexual activity has changed since you were last screened, and you are at increased risk for chlamydia or gonorrhea. Ask your health care provider if you are at risk. Ask your health care provider about whether you are at high risk for HIV. Your health care provider may recommend a prescription medicine to help prevent HIV infection. If you choose to take medicine to prevent HIV, you should first get tested for HIV. You should then be tested every 3 months for as long as you are taking the medicine. Follow these instructions at home: Alcohol use Do not drink alcohol if your health care provider tells you not to drink. If you drink alcohol: Limit how much you have to 0-2 drinks a day. Know how much alcohol is in your drink. In the [place] one drink equals one 12 oz bottle of beer (355 mL), one 5 oz glass of wine (148 mL), or one 1 oz glass of hard liquor (44 mL). Lifestyle Do not use any products that contain nicotine or tobacco. These products include cigarettes, chewing tobacco, and vaping devices, such as e-cigarettes. If you need help quitting, ask your health care provider. Do not use street drugs. Do not share needles. Ask your health care provider for help if you need support or information about quitting drugs. General instructions Schedule regular health,  dental, and eye exams. Stay current with your vaccines. Tell your health care provider if: You often feel depressed. You have ever been abused or do not feel safe at home. Summary Adopting a healthy lifestyle and getting preventive care are important in promoting health and wellness. Follow your health care provider's instructions about healthy diet, exercising, and getting tested or screened for diseases. Follow your health care provider's instructions on monitoring your cholesterol and blood pressure. This information is not intended to replace advice given to you by your health care provider. Make sure you discuss any questions you have with your health care provider. Document Revised: 06/07/2020 Document Reviewed: 06/07/2020 Elsevier Patient Education  2022 [employer].

## 2021-01-26 NOTE — Chronic Care Management (AMB) (Signed)
°  Chronic Care Management   Outreach Note  01/26/2021 Name: Jason Oliver MRN: [id number] DOB: 03-Sep-1950  Jason Oliver is a 70 y.o. year old male who is a primary care patient of Donell Beers, FNP. I reached out to Jason Oliver by phone today in response to a referral sent by Jason Oliver's primary care provider.  An unsuccessful telephone outreach was attempted today. The patient was referred to the case management team for assistance with care management and care coordination.   Follow Up Plan: A HIPAA compliant phone message was left for the patient providing contact information and requesting a return call.  The care management team will reach out to the patient again over the next 7 days. If patient returns call to provider office, please advise to call Embedded Care Management Care Guide Jason Oliver at [phone].  Jason Oliver  Care Guide, Embedded Care Coordination [facility] Management  Direct Dial: [phone]
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
## 2021-02-01 ENCOUNTER — Other Ambulatory Visit: Payer: Self-pay | Admitting: Nurse Practitioner

## 2021-02-01 DIAGNOSIS — I10 Essential (primary) hypertension: Secondary | ICD-10-CM

## 2021-02-01 DIAGNOSIS — E785 Hyperlipidemia, unspecified: Secondary | ICD-10-CM

## 2021-02-01 DIAGNOSIS — E1165 Type 2 diabetes mellitus with hyperglycemia: Secondary | ICD-10-CM

## 2021-02-01 DIAGNOSIS — E1169 Type 2 diabetes mellitus with other specified complication: Secondary | ICD-10-CM

## 2021-02-02 ENCOUNTER — Encounter: Payer: Self-pay | Admitting: Nurse Practitioner

## 2021-02-02 ENCOUNTER — Other Ambulatory Visit: Payer: Self-pay

## 2021-02-02 ENCOUNTER — Ambulatory Visit (INDEPENDENT_AMBULATORY_CARE_PROVIDER_SITE_OTHER): Payer: Medicare Other | Admitting: Nurse Practitioner

## 2021-02-02 VITALS — BP 150/81 | HR 76 | Ht 66.0 in | Wt 247.0 lb

## 2021-02-02 DIAGNOSIS — Z139 Encounter for screening, unspecified: Secondary | ICD-10-CM | POA: Diagnosis not present

## 2021-02-02 DIAGNOSIS — E1165 Type 2 diabetes mellitus with hyperglycemia: Secondary | ICD-10-CM

## 2021-02-02 DIAGNOSIS — I10 Essential (primary) hypertension: Secondary | ICD-10-CM | POA: Diagnosis not present

## 2021-02-02 NOTE — Progress Notes (Signed)
° °  Jason Oliver     MRN: [id number]      DOB: 1950-07-29   HPI Jason Oliver is here for follow up and re-evaluation of chronic medical conditions, medication management and review of any available recent lab and radiology data.  Preventive health is updated, specifically  Cancer screening and Immunization.   Questions or concerns regarding consultations or procedures which the PT has had in the interim are  addressed. The PT denies any adverse reactions to current medications since the last visit.  There are no new concerns.  There are no specific complaints   He started having diarrhea since he started taking his amlodipine/olmersartan and atorvastatin. Metfromin was increased to 1000mg  during his last visit. .    ROS Denies recent fever or chills. Denies sinus pressure, nasal congestion, ear pain or sore throat. Denies chest congestion, productive cough or wheezing. Denies chest pains, palpitations and leg swelling Denies abdominal pain, nausea, vomiting, or constipation.  Has diarrhea Denies dysuria, frequency, hesitancy or incontinence. Denies joint pain, swelling and limitation in mobility. Denies headaches, seizures, numbness, or tingling. Denies depression, anxiety or insomnia. Denies skin break down or rash.   PE  BP (!) 150/81 (BP Location: Right Arm, Patient Position: Sitting, Cuff Size: Large)    Pulse 76    Ht 5\' 6"  (1.676 m)    Wt 247 lb (112 kg)    SpO2 97%    BMI 39.87 kg/m   Patient alert and oriented and in no cardiopulmonary distress.  HEENT: No facial asymmetry, EOMI,     Neck supple .  Chest: Clear to auscultation bilaterally.  CVS: S1, S2 no murmurs, no S3.Regular rate.  ABD: Soft non tender.   Ext: No edema  MS: Adequate ROM spine, shoulders, hips and knees.  Skin: Intact, no ulcerations or rash noted.  Psych: Good eye contact, normal affect. Memory intact not anxious or depressed appearing.  CNS: CN 2-12 intact, power,  normal throughout.no  focal deficits noted.   Assessment & Plan

## 2021-02-02 NOTE — Chronic Care Management (AMB) (Signed)
Chronic Care Management   Note  02/02/2021 Name: Jason Oliver MRN: [id number] DOB: 1950-11-04  [name] is a 71 y.o. year old male who is a primary care patient of Renee Rival, FNP. I reached out to Abdur-Rahim Mick by phone today in response to a referral sent by Mr. Whalen Hockman's PCP.  Mr. Neece was given information about Chronic Care Management services today including:  CCM service includes personalized support from designated clinical staff supervised by his physician, including individualized plan of care and coordination with other care providers 24/7 contact phone numbers for assistance for urgent and routine care needs. Service will only be billed when office clinical staff spend 20 minutes or more in a month to coordinate care. Only one practitioner may furnish and bill the service in a calendar month. The patient may stop CCM services at any time (effective at the end of the month) by phone call to the office staff. The patient is responsible for co-pay (up to 20% after annual deductible is met) if co-pay is required by the individual health plan.   Patient agreed to services and verbal consent obtained.   Follow up plan: Face to Face appointment with care management team member scheduled for: 02/15/21  [place] [facility]  Direct Dial: [phone]

## 2021-02-02 NOTE — Assessment & Plan Note (Signed)
Importance of healthy food choices with portion control discussed as well as eating regularly within 12  hour window.  ° °The need to choose clean green food 50%-75% of time is discussed as well as make water the primary drink and set a goal for 64 ounces daily. ° °Patient reeducated about the importance of committment to minimum of 150 minutes of exercise per week. ° °Three meals at set times with snacks allowed between meals but they must be fruit or vegetable.  ° °Aim to eat  over 12 hour period  for example 7 am to 7 pm. Stop after your last meal of the day.  °

## 2021-02-02 NOTE — Assessment & Plan Note (Signed)
Had diarrhea with metformin 1000mg  BID, cut down to metformin 500mg  BID. referral made to clinical pharmacist for medication management. Pt does notwant injectable medications.  Avoid sugar, sweets, soda, increase exercise, loose weight

## 2021-02-02 NOTE — Patient Instructions (Addendum)
Please get your labs done 2-5 days before your next visit.  It is important that you exercise regularly at least 30 minutes 5 times a week.  Think about what you will eat, plan ahead. Choose " clean, green, fresh or frozen" over canned, processed or packaged foods which are more sugary, salty and fatty. 70 to 75% of food eaten should be vegetables and fruit. Three meals at set times with snacks allowed between meals, but they must be fruit or vegetables. Aim to eat over a 12 hour period , example 7 am to 7 pm, and STOP after  your last meal of the day. Drink water,generally about 64 ounces per day, no other drink is as healthy. Fruit juice is best enjoyed in a healthy way, by EATING the fruit.  Thanks for choosing [facility], we consider it a privelige to serve you.

## 2021-02-02 NOTE — Assessment & Plan Note (Signed)
Pt told to restart amlodipine -olmersartan 10-40. Continue lobetalol and catapres. EGFR today.  DASH diet and commitment to daily physical activity for a minimum of 30 minutes discussed and encouraged, as a part of hypertension management. The importance of attaining a healthy weight is also discussed.  BP/Weight 02/02/2021 01/19/2021 12/17/2020 09/03/7306 06/05/9435  Systolic BP 005 259 102 890 228  Diastolic BP 81 406 95 80 85  Wt. (Lbs) 247 253 255 252 -  BMI 39.87 40.84 43.77 43.26 -

## 2021-02-03 LAB — CMP14+EGFR
ALT: 75 IU/L — ABNORMAL HIGH (ref 0–44)
AST: 38 IU/L (ref 0–40)
Albumin/Globulin Ratio: 1.9 (ref 1.2–2.2)
Albumin: 4.5 g/dL (ref 3.8–4.8)
Alkaline Phosphatase: 55 IU/L (ref 44–121)
BUN/Creatinine Ratio: 8 — ABNORMAL LOW (ref 10–24)
BUN: 8 mg/dL (ref 8–27)
Bilirubin Total: 0.3 mg/dL (ref 0.0–1.2)
CO2: 23 mmol/L (ref 20–29)
Calcium: 9.6 mg/dL (ref 8.6–10.2)
Chloride: 101 mmol/L (ref 96–106)
Creatinine, Ser: 1.04 mg/dL (ref 0.76–1.27)
Globulin, Total: 2.4 g/dL (ref 1.5–4.5)
Glucose: 130 mg/dL — ABNORMAL HIGH (ref 70–99)
Potassium: 4.4 mmol/L (ref 3.5–5.2)
Sodium: 138 mmol/L (ref 134–144)
Total Protein: 6.9 g/dL (ref 6.0–8.5)
eGFR: 78 mL/min/{1.73_m2} (ref >59)

## 2021-02-03 LAB — HEMOGLOBIN A1C
Est. average glucose Bld gHb Est-mCnc: 189 mg/dL
Hgb A1c MFr Bld: 8.2 % — ABNORMAL HIGH (ref 4.8–5.6)

## 2021-02-03 LAB — LIPID PANEL
Chol/HDL Ratio: 3.9 ratio (ref 0.0–5.0)
Cholesterol, Total: 118 mg/dL (ref 100–199)
HDL: 30 mg/dL — ABNORMAL LOW (ref >39)
LDL Chol Calc (NIH): 69 mg/dL (ref 0–99)
Triglycerides: 101 mg/dL (ref 0–149)
VLDL Cholesterol Cal: 19 mg/dL (ref 5–40)

## 2021-02-03 LAB — TSH: TSH: 3.46 u[IU]/mL (ref 0.450–4.500)

## 2021-02-03 LAB — VITAMIN D 25 HYDROXY (VIT D DEFICIENCY, FRACTURES): Vit D, 25-Hydroxy: 63.9 ng/mL (ref 30.0–100.0)

## 2021-02-07 ENCOUNTER — Encounter: Payer: Self-pay | Admitting: *Deleted

## 2021-02-15 ENCOUNTER — Other Ambulatory Visit: Payer: Self-pay

## 2021-02-15 ENCOUNTER — Ambulatory Visit (INDEPENDENT_AMBULATORY_CARE_PROVIDER_SITE_OTHER): Payer: Medicare Other | Admitting: Pharmacist

## 2021-02-15 VITALS — BP 155/73 | HR 87

## 2021-02-15 DIAGNOSIS — I10 Essential (primary) hypertension: Secondary | ICD-10-CM

## 2021-02-15 DIAGNOSIS — E1169 Type 2 diabetes mellitus with other specified complication: Secondary | ICD-10-CM

## 2021-02-15 DIAGNOSIS — E785 Hyperlipidemia, unspecified: Secondary | ICD-10-CM

## 2021-02-15 DIAGNOSIS — E1165 Type 2 diabetes mellitus with hyperglycemia: Secondary | ICD-10-CM

## 2021-02-15 MED ORDER — METFORMIN HCL 500 MG PO TABS: 500.0000 mg | ORAL_TABLET | Freq: Two times a day (BID) | ORAL | 2 refills | Status: DC

## 2021-02-15 NOTE — Patient Instructions (Addendum)
Jason Oliver,  It was great to talk to you today!  Please call me with any questions or concerns.  Visit Information   Following is a copy of your full care plan:  Care Plan : Medication Management  Updates made by Beryle Lathe, [place] since 02/15/2021 12:00 AM     Problem: T2DM, HTN, HLD, Obesity   Priority: High  Onset Date: 02/15/2021     Long-Range Goal: Disease Progression Prevention   Start Date: 02/15/2021  Expected End Date: 05/16/2021  This Visit's Progress: On track  Priority: High  Note:   Current Barriers:  Unable to achieve control of diabetes, hypertension, and weight management  Pharmacist Clinical Goal(s):  Through collaboration with PharmD and provider, patient will  Achieve control of diabetes, hypertension, and weight management as evidenced by improved fasting blood sugar, improved A1c, improved blood pressure control, and continued weight loss   Interventions: 1:1 collaboration with Renee Rival, FNP regarding development and update of comprehensive plan of care as evidenced by provider attestation and co-signature Inter-disciplinary care team collaboration (see longitudinal plan of care) Comprehensive medication review performed; medication list updated in electronic medical record  Type 2 Diabetes - New goal.: Uncontrolled; Most recent A1c above goal of <7% per ADA guidelines Current medications: metformin 500 mg by mouth twice daily Intolerances:  metformin (GI upset with 1,000 mg twice daily) Taking medications as directed: yes Side effects thought to be attributed to current medication regimen:  no, patient reports tolerating metformin 500 mg by mouth twice daily well Hypoglycemia prevention: not indicated at this time Current meal patterns:  does intermittent fasting and does not drink soda or juice Current exercise:  walks a lot every day; used to lift weights On a statin: yes On aspirin 81 mg daily: no Last  microalbumin/creatinine ratio: unknown; on an ACEi/ARB: yes Last eye exam: completed within last year Last foot exam: overdue Current glucose readings:  patient does not check finger stick blood glucose Discussed patient's goals and at this time, he declines adjustment to pharmacotherapy. He wants to work on improving his lifestyle to get his blood glucose under control.  Continue metformin 500 mg by mouth twice daily Encouraged regular aerobic exercise with a goal of 30 minutes five times per week (150 minutes per week)  Hypertension - New goal.: Blood pressure under poor control. Blood pressure is above goal of <130/80 mmHg per 2017 AHA/ACC guidelines. Patient reports he had not taken blood pressure medications today prior to office visit. Current medications: labetalol 100 mg by mouth twice daily, amlodipine-olmesartan 10-52m by mouth once daily, and clonidine 0.2 mg by mouth at bedtime Intolerances: none Taking medications as directed: no, patient reports taking some of his blood pressure medications sporadically (every other day or so) because he feels like they are damaging his organs Side effects thought to be attributed to current medication regimen: no Current home blood pressure: not discussed today Continue current medications as above. Discussed patient's goals and at this time, he declines adjustment to pharmacotherapy. He wants to work on improving his lifestyle to get his blood pressure under control.  Encourage dietary sodium restriction/DASH diet Recommend regular aerobic exercise Recommend home blood pressure monitoring to discuss at next visit Discussed need for and importance of continued work on weight loss Discussed need for medication compliance Reviewed risks of hypertension, principles of treatment and consequences of untreated hypertension  Hyperlipidemia - Condition stable. Not addressed this visit.: Controlled. LDL at goal of <70 due to very high  risk given  diabetes + at least 1 additional major risk factor (hypertension) per 2020 AACE/ACE guidelines. Triglycerides at goal of <150 per 2020 AACE/ACE guidelines. Current medications: atorvastatin 20 mg by mouth once daily Intolerances: none Taking medications as directed: yes Side effects thought to be attributed to current medication regimen: no Continue atorvastatin 20 mg by mouth once daily Encourage dietary reduction of high fat containing foods such as butter, nuts, bacon, egg yolks, etc.  Overweight/Obesity - New goal.: Unable to achieve goal weight loss through lifestyle modification alone Current treatment:  none   Medications previously tried:  unknown Strategies previously tried: intermittent fasting History of bariatric surgery: none Baseline weight: 255 lbs; most recent weight: 247 lbs Recommend that the patient emphasize lean proteins, fruits and vegetables, whole grains and increased fiber consumption, adequate hydration Recommend diet modification to induce energy deficit of 500 kcal/day or greater Encouraged regular aerobic exercise with a goal of 30 minutes five times per week (150 minutes per week) Discussed patient's goals and at this time, he declines pharmacotherapy (GLP-1 agonist). He wants to work on improving his lifestyle to lose weight.  Patient Goals/Self-Care Activities Patient will:  Take medications as prescribed Check blood pressure at least once daily, document, and provide at future appointments Target a minimum of 150 minutes of moderate intensity exercise weekly Engage in dietary modifications by less frequent dining out, decreased fat intake, and fewer sweetened foods & beverages  Follow Up Plan: Face to Face appointment with care management team member scheduled for: 05/03/21      Consent to CCM Services: Mr. Kruse was given information about Chronic Care Management services including:  CCM service includes personalized support from designated clinical  staff supervised by his physician, including individualized plan of care and coordination with other care providers 24/7 contact phone numbers for assistance for urgent and routine care needs. Service will only be billed when office clinical staff spend 20 minutes or more in a month to coordinate care. Only one practitioner may furnish and bill the service in a calendar month. The patient may stop CCM services at any time (effective at the end of the month) by phone call to the office staff. The patient will be responsible for cost sharing (co-pay) of up to 20% of the service fee (after annual deductible is met).  Patient agreed to services and verbal consent obtained.   Plan: Face to Face appointment with care management team member scheduled for: 05/03/21  The patient verbalized understanding of instructions, educational materials, and care plan provided today and agreed to receive a mailed copy of patient instructions, educational materials, and care plan.   Please call the care guide team at [phone] if you need to cancel or reschedule your appointment.   Kennon Holter, PharmD, [place], CPP Clinical Pharmacist Practitioner [facility] [phone]

## 2021-02-15 NOTE — Chronic Care Management (AMB) (Addendum)
Chronic Care Management Pharmacy Note  02/15/2021 Name:  Jason Oliver MRN:  [id number] DOB:  1950/04/03  Summary: Type 2 Diabetes Uncontrolled; Most recent A1c above goal of <7% per ADA guidelines Intolerances:  metformin (GI upset with 1,000 mg twice daily) Patient reports tolerating metformin 500 mg by mouth twice daily well Current glucose readings:  patient does not check finger stick blood glucose Discussed patient's goals and at this time, he declines adjustment to pharmacotherapy. He wants to work on improving his lifestyle to get his blood glucose under control.  Continue metformin 500 mg by mouth twice daily Encouraged regular aerobic exercise with a goal of 30 minutes five times per week (150 minutes per week)  Hypertension Blood pressure under poor control. Blood pressure is above goal of <130/80 mmHg per 2017 AHA/ACC guidelines. Patient reports he had not taken blood pressure medications today prior to office visit. Current medications: labetalol 100 mg by mouth twice daily, amlodipine-olmesartan 10-40mg  by mouth once daily, and clonidine 0.2 mg by mouth at bedtime Patient reports taking some of his blood pressure medications sporadically (every other day or so) because he feels like they are damaging his organs Continue current medications as above. Discussed patient's goals and at this time, he declines adjustment to pharmacotherapy. He wants to work on improving his lifestyle to get his blood pressure under control.  Encourage dietary sodium restriction/DASH diet Recommend regular aerobic exercise Recommend home blood pressure monitoring to discuss at next visit Discussed need for and importance of continued work on weight loss Discussed need for medication compliance Reviewed risks of hypertension, principles of treatment and consequences of untreated hypertension  Overweight/Obesity Baseline weight: 255 lbs; most recent weight: 247 lbs Recommend that the patient  emphasize lean proteins, fruits and vegetables, whole grains and increased fiber consumption, adequate hydration Recommend diet modification to induce energy deficit of 500 kcal/day or greater Encouraged regular aerobic exercise with a goal of 30 minutes five times per week (150 minutes per week) Discussed patient's goals and at this time, he declines pharmacotherapy (GLP-1 agonist). He wants to work on improving his lifestyle to lose weight.  Subjective: Jason Oliver is an 71 y.o. year old male who is a primary patient of Donell Beers, FNP.  The CCM team was consulted for assistance with disease management and care coordination needs.    Engaged with patient face to face for initial visit in response to provider referral for pharmacy case management and/or care coordination services.   Consent to Services:  The patient was given the following information about Chronic Care Management services today, agreed to services, and gave verbal consent: 1. CCM service includes personalized support from designated clinical staff supervised by the primary care provider, including individualized plan of care and coordination with other care providers 2. 24/7 contact phone numbers for assistance for urgent and routine care needs. 3. Service will only be billed when office clinical staff spend 20 minutes or more in a month to coordinate care. 4. Only one practitioner may furnish and bill the service in a calendar month. 5.The patient may stop CCM services at any time (effective at the end of the month) by phone call to the office staff. 6. The patient will be responsible for cost sharing (co-pay) of up to 20% of the service fee (after annual deductible is met). Patient agreed to services and consent obtained.  Patient Care Team: Donell Beers, FNP as PCP - General (Nurse Practitioner) Gavin Pound, [facility] (Pharmacist)  Objective:  Lab Results  Component Value Date   CREATININE 1.04  02/02/2021   CREATININE 0.97 01/19/2021   CREATININE 0.98 12/17/2020    Lab Results  Component Value Date   HGBA1C 8.2 (H) 02/02/2021   Last diabetic Eye exam:  Lab Results  Component Value Date/Time   HMDIABEYEEXA No Retinopathy 08/16/2020 12:00 AM    Last diabetic Foot exam: No results found for: HMDIABFOOTEX      Component Value Date/Time   CHOL 118 02/02/2021 0959   TRIG 101 02/02/2021 0959   HDL 30 (L) 02/02/2021 0959   CHOLHDL 3.9 02/02/2021 0959   LDLCALC 69 02/02/2021 0959    Hepatic Function Latest Ref Rng & Units 02/02/2021 01/19/2021 10/15/2019  Total Protein 6.0 - 8.5 g/dL 6.9 7.6 -  Albumin 3.8 - 4.8 g/dL 4.5 4.9(H) 4.5  AST 0 - 40 IU/L 38 24 33  ALT 0 - 44 IU/L 75(H) 63(H) 72(A)  Alk Phosphatase 44 - 121 IU/L 55 66 57  Total Bilirubin 0.0 - 1.2 mg/dL 0.3 0.5 -    Lab Results  Component Value Date/Time   TSH 3.460 02/02/2021 09:59 AM   TSH 1.67 10/15/2019 12:00 AM    CBC Latest Ref Rng & Units 12/17/2020 10/15/2019 11/05/2006  WBC 4.0 - 10.5 K/uL 5.5 4.8 5.0  Hemoglobin 13.0 - 17.0 g/dL 13.9 13.6 13.6  Hematocrit 39.0 - 52.0 % 39.9 40(A) 39.9  Platelets 150 - 400 K/uL 208 214 256    Lab Results  Component Value Date/Time   VD25OH 63.9 02/02/2021 09:59 AM    Clinical ASCVD: No  The ASCVD Risk score (Arnett DK, et al., 2019) failed to calculate for the following reasons:   The valid total cholesterol range is 130 to 320 mg/dL    Social History   Tobacco Use  Smoking Status Former   Types: Cigarettes  Smokeless Tobacco Never  Tobacco Comments   He quit smoking in the 90's, started smoking in the teenage years , smoked like 4 sticks per day then.    BP Readings from Last 3 Encounters:  02/15/21 (!) 155/73  02/02/21 (!) 150/81  01/19/21 (!) 189/107   Pulse Readings from Last 3 Encounters:  02/15/21 87  02/02/21 76  01/19/21 82   Wt Readings from Last 3 Encounters:  02/02/21 247 lb (112 kg)  01/19/21 253 lb (114.8 kg)  12/17/20 255 lb  (115.7 kg)    Assessment: Review of patient past medical history, allergies, medications, health status, including review of consultants reports, laboratory and other test data, was performed as part of comprehensive evaluation and provision of chronic care management services.   SDOH:  (Social Determinants of Health) assessments and interventions performed:    CCM Care Plan  Allergies  Allergen Reactions   Novocain [Procaine] Swelling    Medications Reviewed Today     Reviewed by Beryle Lathe, [facility] (Pharmacist) on 02/15/21 at 1516  Med List Status: <None>   Medication Order Taking? Sig Documenting Provider Last Dose Status Informant  Alpha Lipoic Acid 200 MG CAPS [id number] Yes Take by mouth. [provider] Taking Active   amLODipine-olmesartan (AZOR) 10-40 MG tablet [id number] Yes Take 1 tablet by mouth daily. Renee Rival, FNP Taking Active   atorvastatin (LIPITOR) 20 MG tablet [id number] Yes Take 1 tablet (20 mg total) by mouth daily. Renee Rival, FNP Taking Active   Blood Pressure Monitor DEVI [id number] Yes Use to check Blood Pressure 3 times a week [place], Dewaine Conger,  FNP Taking Active   Cholecalciferol (VITAMIN D3 PO) [id number] Yes Take 10,000 Units by mouth daily. [provider] Taking Active   cloNIDine (CATAPRES) 0.2 MG tablet [id number] Yes Take 0.2 mg by mouth at bedtime. [provider] Taking Active   labetalol (NORMODYNE) 100 MG tablet [id number] Yes Take 1 tablet (100 mg total) by mouth 2 (two) times daily. Renee Rival, FNP Taking Active   metFORMIN (GLUCOPHAGE) 500 MG tablet [id number] Yes Take 2 tablets (1,000 mg total) by mouth 2 (two) times daily with a meal.  Patient taking differently: Take 500 mg by mouth 2 (two) times daily with a meal.   Renee Rival, FNP Taking Active             Patient Active Problem List   Diagnosis Date Noted   High blood pressure 01/19/2021   Uncontrolled type 2  diabetes mellitus with hyperglycemia ([place]) 01/19/2021   Screening for colon cancer 01/19/2021   Morbid obesity ([place]) 01/19/2021     There is no immunization history on file for this patient.  Conditions to be addressed/monitored: HTN, HLD, DMII, and Obesity  Care Plan : Medication Management  Updates made by Beryle Lathe, [place] since 02/15/2021 12:00 AM     Problem: T2DM, HTN, HLD, Obesity   Priority: High  Onset Date: 02/15/2021     Long-Range Goal: Disease Progression Prevention   Start Date: 02/15/2021  Expected End Date: 05/16/2021  This Visit's Progress: On track  Priority: High  Note:   Current Barriers:  Unable to achieve control of diabetes, hypertension, and weight management  Pharmacist Clinical Goal(s):  Through collaboration with PharmD and provider, patient will  Achieve control of diabetes, hypertension, and weight management as evidenced by improved fasting blood sugar, improved A1c, improved blood pressure control, and continued weight loss   Interventions: 1:1 collaboration with Renee Rival, FNP regarding development and update of comprehensive plan of care as evidenced by provider attestation and co-signature Inter-disciplinary care team collaboration (see longitudinal plan of care) Comprehensive medication review performed; medication list updated in electronic medical record  Type 2 Diabetes - New goal.: Uncontrolled; Most recent A1c above goal of <7% per ADA guidelines Current medications: metformin 500 mg by mouth twice daily Intolerances:  metformin (GI upset with 1,000 mg twice daily) Taking medications as directed: yes Side effects thought to be attributed to current medication regimen:  no, patient reports tolerating metformin 500 mg by mouth twice daily well Hypoglycemia prevention: not indicated at this time Current meal patterns:  does intermittent fasting and does not drink soda or juice Current exercise:  walks a lot every day;  used to lift weights On a statin: yes On aspirin 81 mg daily: no Last microalbumin/creatinine ratio: unknown; on an ACEi/ARB: yes Last eye exam: completed within last year Last foot exam: overdue Current glucose readings:  patient does not check finger stick blood glucose Discussed patient's goals and at this time, he declines adjustment to pharmacotherapy. He wants to work on improving his lifestyle to get his blood glucose under control.  Continue metformin 500 mg by mouth twice daily Encouraged regular aerobic exercise with a goal of 30 minutes five times per week (150 minutes per week)  Hypertension - New goal.: Blood pressure under poor control. Blood pressure is above goal of <130/80 mmHg per 2017 AHA/ACC guidelines. Patient reports he had not taken blood pressure medications today prior to office visit. Current medications: labetalol 100 mg by mouth twice daily, amlodipine-olmesartan 10-40mg   by mouth once daily, and clonidine 0.2 mg by mouth at bedtime Intolerances: none Taking medications as directed: no, patient reports taking some of his blood pressure medications sporadically (every other day or so) because he feels like they are damaging his organs Side effects thought to be attributed to current medication regimen: no Current home blood pressure: not discussed today Continue current medications as above. Discussed patient's goals and at this time, he declines adjustment to pharmacotherapy. He wants to work on improving his lifestyle to get his blood pressure under control.  Encourage dietary sodium restriction/DASH diet Recommend regular aerobic exercise Recommend home blood pressure monitoring to discuss at next visit Discussed need for and importance of continued work on weight loss Discussed need for medication compliance Reviewed risks of hypertension, principles of treatment and consequences of untreated hypertension  Hyperlipidemia - Condition stable. Not addressed this  visit.: Controlled. LDL at goal of <70 due to very high risk given diabetes + at least 1 additional major risk factor (hypertension) per 2020 AACE/ACE guidelines. Triglycerides at goal of <150 per 2020 AACE/ACE guidelines. Current medications: atorvastatin 20 mg by mouth once daily Intolerances: none Taking medications as directed: yes Side effects thought to be attributed to current medication regimen: no Continue atorvastatin 20 mg by mouth once daily Encourage dietary reduction of high fat containing foods such as butter, nuts, bacon, egg yolks, etc.  Overweight/Obesity - New goal.: Unable to achieve goal weight loss through lifestyle modification alone Current treatment:  none   Medications previously tried:  unknown Strategies previously tried: intermittent fasting History of bariatric surgery: none Baseline weight: 255 lbs; most recent weight: 247 lbs Recommend that the patient emphasize lean proteins, fruits and vegetables, whole grains and increased fiber consumption, adequate hydration Recommend diet modification to induce energy deficit of 500 kcal/day or greater Encouraged regular aerobic exercise with a goal of 30 minutes five times per week (150 minutes per week) Discussed patient's goals and at this time, he declines pharmacotherapy (GLP-1 agonist). He wants to work on improving his lifestyle to lose weight.  Patient Goals/Self-Care Activities Patient will:  Take medications as prescribed Check blood pressure at least once daily, document, and provide at future appointments Target a minimum of 150 minutes of moderate intensity exercise weekly Engage in dietary modifications by less frequent dining out, decreased fat intake, and fewer sweetened foods & beverages  Follow Up Plan: Face to Face appointment with care management team member scheduled for: 05/03/21      Medication Assistance: None required.  Patient affirms current coverage meets needs.  Patient's preferred  pharmacy is:  [id number] - [place], [address] [place] [address] Phone: [phone] Fax: [phone]  Follow Up:  Patient agrees to Care Plan and Follow-up.  Plan: Face to Face appointment with care management team member scheduled for: 05/03/21  Kennon Holter, PharmD, [place], CPP Clinical Pharmacist Practitioner [facility] [phone]

## 2021-03-01 DIAGNOSIS — I10 Essential (primary) hypertension: Secondary | ICD-10-CM | POA: Diagnosis not present

## 2021-03-01 DIAGNOSIS — E1169 Type 2 diabetes mellitus with other specified complication: Secondary | ICD-10-CM

## 2021-03-01 DIAGNOSIS — E785 Hyperlipidemia, unspecified: Secondary | ICD-10-CM

## 2021-03-01 DIAGNOSIS — Z7984 Long term (current) use of oral hypoglycemic drugs: Secondary | ICD-10-CM | POA: Diagnosis not present

## 2021-03-02 ENCOUNTER — Other Ambulatory Visit: Payer: Self-pay

## 2021-03-02 ENCOUNTER — Encounter: Payer: Medicare Other | Attending: Nurse Practitioner | Admitting: Nutrition

## 2021-03-02 VITALS — Ht 63.0 in | Wt 246.0 lb

## 2021-03-02 DIAGNOSIS — Z713 Dietary counseling and surveillance: Secondary | ICD-10-CM | POA: Diagnosis not present

## 2021-03-02 DIAGNOSIS — E1165 Type 2 diabetes mellitus with hyperglycemia: Secondary | ICD-10-CM | POA: Diagnosis not present

## 2021-03-02 DIAGNOSIS — E782 Mixed hyperlipidemia: Secondary | ICD-10-CM

## 2021-03-02 DIAGNOSIS — I10 Essential (primary) hypertension: Secondary | ICD-10-CM

## 2021-03-02 NOTE — Progress Notes (Signed)
Medical Nutrition Therapy  Appointment Start time:  0800  Appointment End time:  0900  Primary concerns today: DM Type 2  Referral diagnosis: E11.8 Preferred learning style: read   Learning readiness: contemplating   NUTRITION ASSESSMENT   Changes made since being diagnosed and was in hospital 11/2020. Cut out chips, cookies, anything sweet, drinking only water,  Cut down on carbs. Walking more now, [facility]. Feels much better.   Anthropometrics  Wt Readings from Last 3 Encounters:  03/02/21 246 lb (111.6 kg)  02/02/21 247 lb (112 kg)  01/19/21 253 lb (114.8 kg)   Ht Readings from Last 3 Encounters:  03/02/21 5\' 3"  (1.6 m)  02/02/21 5\' 6"  (1.676 m)  01/19/21 5\' 6"  (1.676 m)   Body mass index is 43.58 kg/m. @BMIFA @ Facility age limit for growth percentiles is 20 years. Facility age limit for growth percentiles is 20 years.    Clinical Medical Hx: Ht High cholesterol and DM, obesity, Sleep apnea Medications: Metformin 500 mg BID Labs:  Lab Results  Component Value Date   HGBA1C 8.2 (H) 02/02/2021   CMP Latest Ref Rng & Units 02/02/2021 01/19/2021 12/17/2020  Glucose 70 - 99 mg/dL 04/02/2021) 04/02/2021) 01/21/2021)  BUN 8 - 27 mg/dL 8 8 10   Creatinine 0.76 - 1.27 mg/dL 12/19/2020 185(T 093(J  Sodium 134 - 144 mmol/L 138 140 135  Potassium 3.5 - 5.2 mmol/L 4.4 4.7 4.1  Chloride 96 - 106 mmol/L 101 101 100  CO2 20 - 29 mmol/L 23 24 26   Calcium 8.6 - 10.2 mg/dL 9.6 121(K 9.5  Total Protein 6.0 - 8.5 g/dL 6.9 7.6 -  Total Bilirubin 0.0 - 1.2 mg/dL 0.3 0.5 -  Alkaline Phos 44 - 121 IU/L 55 66 -  AST 0 - 40 IU/L 38 24 -  ALT 0 - 44 IU/L 75(H) 63(H) -   Lipid Panel     Component Value Date/Time   CHOL 118 02/02/2021 0959   TRIG 101 02/02/2021 0959   HDL 30 (L) 02/02/2021 0959   CHOLHDL 3.9 02/02/2021 0959   LDLCALC 69 02/02/2021 0959   LABVLDL 19 02/02/2021 0959    Notable Signs/Symptoms: Increaed   Lifestyle & Dietary Hx Lives with his brother. He  cooks and shops for himself. Eats 1 meal per day  Estimated daily fluid intake: 40 oz Supplements: LInolaci Acid Sleep: 6-8 hrs Stress / self-care: no Current average weekly physical activity: Walks  daily.  24-Hr Dietary Recall First Meal:  7 am Cabbage, 2 boiled eggs,  Snack:  Second Meal: cauliflower, kale, cabbage, carrots smoothie 16 oz, water or kombuchi Snack:  Third Meal: Soup-artichokes, cauliflower, mushrooms.asparagus, water Snack:  Beverages: water, kombuchi  probiotics  Estimated Energy Needs Calories: 1800 Carbohydrate: 200g Protein: 135g Fat: 50g   NUTRITION DIAGNOSIS  NB-1.1 Food and nutrition-related knowledge deficit As related to Diabetes Type 2.  As evidenced by A1C8.2% .   NUTRITION INTERVENTION  Nutrition education (E-1) on the following topics:  Nutrition and Diabetes education provided on My Plate, CHO counting, meal planning, portion sizes, timing of meals, avoiding snacks between meals unless having a low blood sugar, target ranges for A1C and blood sugars, signs/symptoms and treatment of hyper/hypoglycemia, monitoring blood sugars, taking medications as prescribed, benefits of exercising 30 minutes per day and prevention of complications of DM. Lifestyle Medicine - Whole Food, Plant Predominant Nutrition is highly recommended: Eat Plenty of vegetables, Mushrooms, fruits, Legumes, Whole Grains, Nuts, seeds in lieu of  processed meats, processed snacks/pastries red meat, poultry, eggs.    -It is better to avoid simple carbohydrates including: Cakes, Sweet Desserts, Ice Cream, Soda (diet and regular), Sweet Tea, Candies, Chips, Cookies, Store Bought Juices, Alcohol in Excess of  1-2 drinks a day, Lemonade,  Artificial Sweeteners, Doughnuts, Coffee Creamers, "Sugar-free" Products, etc, etc.  This is not a complete list.....  Exercise: If you are able: 30 -60 minutes a day ,4 days a week, or 150 minutes a week.  The longer the better.  Combine stretch,  strength, and aerobic activities.  If you were told in the past that you have high risk for cardiovascular diseases, you may seek evaluation by your heart doctor prior to initiating moderate to intense exercise programs.  Handouts Provided Include  Plant based lifestyle medicine Meal Plan Card Nutrient density of foods Nutrition lifestyle  Learning Style & Readiness for Change Teaching method utilized: Visual & Auditory  Demonstrated degree of understanding via: Teach Back  Barriers to learning/adherence to lifestyle change: non3  Goals Established by Pt Goals  Eat three balanced meals per day at times discussed. Focus on more plant based foods Avoid processed foods Drink a gallon of water per day Get in 150 minutes or more a week of exercise Lose 1-2 lbs per week Check BS twice a day; goals in am less than 130, bedtime, less than 150. Avoid snacks between meals. Take Metformin AFTER breakfast, not on an empty stomach. Get A1C down to 7% or less.   MONITORING & EVALUATION Dietary intake, weekly physical activity, and weight and blood sugars in 1 month.  Next Steps  Patient is to work on eating 3 meals per day.

## 2021-03-17 ENCOUNTER — Other Ambulatory Visit: Payer: Self-pay | Admitting: Nurse Practitioner

## 2021-03-17 DIAGNOSIS — I10 Essential (primary) hypertension: Secondary | ICD-10-CM

## 2021-03-22 ENCOUNTER — Encounter: Payer: Self-pay | Admitting: Nutrition

## 2021-03-22 NOTE — Patient Instructions (Signed)
Goals  Eat three balanced meals per day at times discussed. Focus on more plant based foods Avoid processed foods Drink a gallon of water per day Get in 150 minutes or more a week of exercise Lose 1-2 lbs per week Check BS twice a day; goals in am less than 130, bedtime, less than 150. Avoid snacks between meals. Take Metformin AFTER breakfast, not on an empty stomach. Get A1C down to 7% or less.

## 2021-03-30 ENCOUNTER — Ambulatory Visit: Payer: Medicare Other | Admitting: Nutrition

## 2021-04-27 ENCOUNTER — Ambulatory Visit: Payer: Medicare Other | Admitting: Nutrition

## 2021-04-27 ENCOUNTER — Telehealth: Payer: Self-pay | Admitting: Nutrition

## 2021-04-27 NOTE — Telephone Encounter (Signed)
Vm left to call back and r/s missed appt. ?

## 2021-05-03 ENCOUNTER — Ambulatory Visit: Payer: Medicare Other

## 2021-05-03 ENCOUNTER — Encounter: Payer: Self-pay | Admitting: Nurse Practitioner

## 2021-05-03 ENCOUNTER — Ambulatory Visit (INDEPENDENT_AMBULATORY_CARE_PROVIDER_SITE_OTHER): Payer: Medicare Other | Admitting: Nurse Practitioner

## 2021-05-03 VITALS — BP 160/90 | HR 78 | Ht 66.0 in | Wt 241.0 lb

## 2021-05-03 DIAGNOSIS — B353 Tinea pedis: Secondary | ICD-10-CM

## 2021-05-03 DIAGNOSIS — Z0000 Encounter for general adult medical examination without abnormal findings: Secondary | ICD-10-CM | POA: Insufficient documentation

## 2021-05-03 DIAGNOSIS — E1165 Type 2 diabetes mellitus with hyperglycemia: Secondary | ICD-10-CM

## 2021-05-03 DIAGNOSIS — E1169 Type 2 diabetes mellitus with other specified complication: Secondary | ICD-10-CM | POA: Diagnosis not present

## 2021-05-03 DIAGNOSIS — I10 Essential (primary) hypertension: Secondary | ICD-10-CM | POA: Diagnosis not present

## 2021-05-03 DIAGNOSIS — E785 Hyperlipidemia, unspecified: Secondary | ICD-10-CM

## 2021-05-03 DIAGNOSIS — H6123 Impacted cerumen, bilateral: Secondary | ICD-10-CM | POA: Insufficient documentation
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
## 2021-05-03 NOTE — Assessment & Plan Note (Addendum)
Has metformin 500mg  BID ordered ?Pt reports poor adherence to medication regimen.  ?Foot exam today  ?Check A1C today, urine microalbumin ?On Lipitor 20 mg daily.  ? ?

## 2021-05-03 NOTE — Assessment & Plan Note (Addendum)
Patient referred to podiatry for treatment ?Keep feet clean and dry ?RX cicloporox 0.77 % cream BID for up to 4 weeks  ?

## 2021-05-03 NOTE — Assessment & Plan Note (Signed)
Pt refused ear flushing today ?Denies trouble hearing  ?

## 2021-05-03 NOTE — Assessment & Plan Note (Addendum)
BP Readings from Last 3 Encounters:  ?05/03/21 (!) 160/84  ?02/15/21 (!) 155/73  ?02/02/21 (!) 150/81  ?Medications for BP are amlodipine-olmersatrn 10-76m , clonidine 0.254mdaily, labetalol 10052mID ?pt reports that he has not been taking his medications everyday, he promised to start taking his medications daily. ?DASH diet and commitment to daily physical activity for a minimum of 30 minutes discussed and encouraged, as a part of hypertension management. ?The importance of attaining a healthy weight is also discussed. ?Will refer pt to advance HTN clinic if pt continues to be non adherence to his BP medications.  Need to take medications as prescribed to reduce risk of MI, stroke discussed with patient he verbalized understanding. ?Follow-up in 1 months to recheck blood pressure ?Check CMP , EGFR  ? ? ?

## 2021-05-03 NOTE — Assessment & Plan Note (Signed)
Annual exam as documented.  ?Counseling done include healthy lifestyle involving committing to 150 minutes of exercise per week, heart healthy diet, and attaining healthy weight. The importance of adequate sleep also discussed.  ?Regular use of seat belt and home safety were also discussed . ?Changes in health habits are decided on by patient with goals and time frames set for achieving them. ?Immunization and cancer screening  needs are specifically addressed at this visit.  Pt refused all immunization due. Pt encouraged to get his colon cancer screening test done.  ?

## 2021-05-03 NOTE — Assessment & Plan Note (Signed)
LDL goal is less than 70 ?On crestor 20mg   ?Check lipid panel.  ?

## 2021-05-03 NOTE — Patient Instructions (Addendum)
Please get your labs done today as discussed.  ? ? ? ? ? ? ?It is important that you exercise regularly at least 30 minutes 5 times a week.  ?Think about what you will eat, plan ahead. ?Choose " clean, green, fresh or frozen" over canned, processed or packaged foods which are more sugary, salty and fatty. ?70 to 75% of food eaten should be vegetables and fruit. ?Three meals at set times with snacks allowed between meals, but they must be fruit or vegetables. ?Aim to eat over a 12 hour period , example 7 am to 7 pm, and STOP after  your last meal of the day. ?Drink water,generally about 64 ounces per day, no other drink is as healthy. Fruit juice is best enjoyed in a healthy way, by EATING the fruit. ? ?Thanks for choosing [facility], we consider it a privelige to serve you. ? ?

## 2021-05-04 ENCOUNTER — Telehealth: Payer: Self-pay | Admitting: Nurse Practitioner

## 2021-05-04 MED ORDER — CICLOPIROX OLAMINE 0.77 % EX CREA: TOPICAL_CREAM | Freq: Two times a day (BID) | CUTANEOUS | 0 refills | Status: DC

## 2021-05-04 NOTE — Progress Notes (Signed)
I sent in cicloporox 0.77% cream to be applied in between his toes BID to treat the fungal infection ?Keep feet clean and dry.

## 2021-05-04 NOTE — Telephone Encounter (Signed)
Return call

## 2021-05-04 NOTE — Progress Notes (Signed)
Congratulations, your A1c has greatly improved from 8.2-6.4, please continue to take your diabetic medication and order medications as ordered, continue to exercise regularly and continue to avoid sugar sweets soda.  Please follow-up as scheduled to recheck your blood pressure

## 2021-05-04 NOTE — Addendum Note (Signed)
Addended by: Donell Beers on: 05/04/2021 08:46 AM ? ? Modules accepted: Orders ? ?

## 2021-05-07 LAB — CMP14+EGFR
ALT: 35 IU/L (ref 0–44)
AST: 12 IU/L (ref 0–40)
Albumin/Globulin Ratio: 2.2 (ref 1.2–2.2)
Albumin: 5 g/dL — ABNORMAL HIGH (ref 3.8–4.8)
Alkaline Phosphatase: 49 IU/L (ref 44–121)
BUN/Creatinine Ratio: 13 (ref 10–24)
BUN: 12 mg/dL (ref 8–27)
Bilirubin Total: 0.5 mg/dL (ref 0.0–1.2)
CO2: 25 mmol/L (ref 20–29)
Calcium: 10 mg/dL (ref 8.6–10.2)
Chloride: 102 mmol/L (ref 96–106)
Creatinine, Ser: 0.95 mg/dL (ref 0.76–1.27)
Globulin, Total: 2.3 g/dL (ref 1.5–4.5)
Glucose: 101 mg/dL — ABNORMAL HIGH (ref 70–99)
Potassium: 4.6 mmol/L (ref 3.5–5.2)
Sodium: 141 mmol/L (ref 134–144)
Total Protein: 7.3 g/dL (ref 6.0–8.5)
eGFR: 87 mL/min/{1.73_m2} (ref >59)

## 2021-05-07 LAB — CBC WITH DIFFERENTIAL/PLATELET
Basophils Absolute: 0 10*3/uL (ref 0.0–0.2)
Basos: 1 %
EOS (ABSOLUTE): 0.2 10*3/uL (ref 0.0–0.4)
Eos: 4 %
Hematocrit: 39.7 % (ref 37.5–51.0)
Hemoglobin: 13.4 g/dL (ref 13.0–17.7)
Immature Grans (Abs): 0 10*3/uL (ref 0.0–0.1)
Immature Granulocytes: 0 %
Lymphocytes Absolute: 2.2 10*3/uL (ref 0.7–3.1)
Lymphs: 47 %
MCH: 30.9 pg (ref 26.6–33.0)
MCHC: 33.8 g/dL (ref 31.5–35.7)
MCV: 92 fL (ref 79–97)
Monocytes Absolute: 0.4 10*3/uL (ref 0.1–0.9)
Monocytes: 8 %
Neutrophils Absolute: 1.9 10*3/uL (ref 1.4–7.0)
Neutrophils: 40 %
Platelets: 242 10*3/uL (ref 150–450)
RBC: 4.34 x10E6/uL (ref 4.14–5.80)
RDW: 13.2 % (ref 11.6–15.4)
WBC: 4.6 10*3/uL (ref 3.4–10.8)

## 2021-05-07 LAB — MICROALBUMIN / CREATININE URINE RATIO
Creatinine, Urine: 140.8 mg/dL
Microalb/Creat Ratio: 4 mg/g creat (ref 0–29)
Microalbumin, Urine: 6 ug/mL

## 2021-05-07 LAB — HEMOGLOBIN A1C
Est. average glucose Bld gHb Est-mCnc: 137 mg/dL
Hgb A1c MFr Bld: 6.4 % — ABNORMAL HIGH (ref 4.8–5.6)

## 2021-05-13 ENCOUNTER — Other Ambulatory Visit: Payer: Self-pay | Admitting: Nurse Practitioner

## 2021-05-13 DIAGNOSIS — I10 Essential (primary) hypertension: Secondary | ICD-10-CM

## 2021-05-13 MED ORDER — AMLODIPINE-OLMESARTAN 10-40 MG PO TABS: 1.0000 | ORAL_TABLET | Freq: Every day | ORAL | 0 refills | Status: DC

## 2021-05-16 ENCOUNTER — Ambulatory Visit: Payer: Medicare Other | Admitting: Podiatry

## 2021-05-17 ENCOUNTER — Other Ambulatory Visit: Payer: Self-pay | Admitting: Nurse Practitioner

## 2021-05-17 DIAGNOSIS — B353 Tinea pedis: Secondary | ICD-10-CM

## 2021-05-24 ENCOUNTER — Telehealth: Payer: Self-pay | Admitting: *Deleted

## 2021-05-24 ENCOUNTER — Telehealth: Payer: Medicare Other

## 2021-05-24 NOTE — Telephone Encounter (Signed)
?  Care Management  ? ?Follow Up Note ? ? ?05/24/2021 ?Name: Jason Oliver MRN: [id number] DOB: 1950/08/18 ? ? ?Referred by: Donell Beers, FNP ?Reason for referral : Chronic Care Management ? ?Telephone outreach call with patient for initial assessment, spoke with patient who states " today is not a good day for me to talk, I prefer a Thursday", pt agreeable to have appointment rescheduled. ? ? ?Follow Up Plan: Telephone follow up appointment with care management team member scheduled for: 06/23/21 at 1230 pm ? ?[name] [facility], BSN ?RN Case Manager ?[facility] ?[phone] ? ? ?

## 2021-06-15 ENCOUNTER — Ambulatory Visit (INDEPENDENT_AMBULATORY_CARE_PROVIDER_SITE_OTHER): Payer: Medicare Other | Admitting: Nurse Practitioner

## 2021-06-15 ENCOUNTER — Encounter: Payer: Self-pay | Admitting: Nurse Practitioner

## 2021-06-15 VITALS — BP 130/78 | HR 68 | Ht 66.0 in | Wt 240.0 lb

## 2021-06-15 DIAGNOSIS — I10 Essential (primary) hypertension: Secondary | ICD-10-CM

## 2021-06-15 DIAGNOSIS — E1165 Type 2 diabetes mellitus with hyperglycemia: Secondary | ICD-10-CM

## 2021-06-15 MED ORDER — METFORMIN HCL 500 MG PO TABS: 500.0000 mg | ORAL_TABLET | Freq: Two times a day (BID) | ORAL | 2 refills | Status: DC

## 2021-06-15 NOTE — Assessment & Plan Note (Signed)
Lab Results  ?Component Value Date  ? HGBA1C 6.4 (H) 05/03/2021  ?Chronic condition well-controlled on metformin 500 mg twice daily ?Continue current medication  ?Avoid sugar sweets order continue to exercise at least 150 minutes weekly ?

## 2021-06-15 NOTE — Patient Instructions (Addendum)

## 2021-06-15 NOTE — Assessment & Plan Note (Signed)
BP Readings from Last 3 Encounters:  ?06/15/21 130/78  ?05/03/21 (!) 160/90  ?02/15/21 (!) 155/73  ?Blood pressure well controlled on amlodipine-olmesartan 10-40 mg 1 tablet daily, labetalol 100 mg twice daily ?Patient encouraged to continue to take medication daily ?DASH diet advised, continue to exercise at least 150 minutes weekly ?Follow-up in 5 months ?

## 2021-06-15 NOTE — Progress Notes (Signed)
? ?  Jason Oliver     MRN: [id number]      DOB: 05-Aug-1950 ? ? ?HPI ?Jason Oliver  with PMH of diabetes , HTN, HLD is here for follow up for HTN. Pt stated that he has been taking his medications as ordered. Doing more exercises, eating less carbohydrates.,taking amlodipine-olmerstarn 10-40mg  tablet daily, labetalol 100mg  BID. Denies chest pains, palpitations and leg swelling, Did not bring his BP log but states that his systolic BP has been below 130.  ? ? ? ? ?ROS ?Denies recent fever or chills. ?Denies sinus pressure, nasal congestion, ear pain or sore throat. ?Denies chest congestion, productive cough or wheezing. ?Denies chest pains, palpitations and leg swelling ?Denies abdominal pain, nausea, vomiting,diarrhea or constipation.   ?Denies dysuria, frequency, hesitancy or incontinence. ?Denies joint pain, swelling and limitation in mobility. ?Denies headaches, seizures, numbness, or tingling. ?Denies depression, anxiety or insomnia. ? ? ? ?PE ? ?BP 130/78 (BP Location: Left Arm, Cuff Size: Large)   Pulse 68   Ht 5\' 6"  (1.676 m)   Wt 240 lb (108.9 kg)   SpO2 98%   BMI 38.74 kg/m?  ? ?Patient alert and oriented and in no cardiopulmonary distress. ? ?Chest: Clear to auscultation bilaterally. ? ?CVS: S1, S2 no murmurs, no S3.Regular rate. ? ?ABD: Soft non tender.  ? ?Ext: No edema ? ?MS: Adequate ROM spine, shoulders, hips and knees. ? ?Psych: Good eye contact, normal affect. Memory intact not anxious or depressed appearing. ? ? ? ?Assessment & Plan ? ?High blood pressure ?BP Readings from Last 3 Encounters:  ?06/15/21 130/78  ?05/03/21 (!) 160/90  ?02/15/21 (!) 155/73  ?Blood pressure well controlled on amlodipine-olmesartan 10-40 mg 1 tablet daily, labetalol 100 mg twice daily ?Patient encouraged to continue to take medication daily ?DASH diet advised, continue to exercise at least 150 minutes weekly ?Follow-up in 5 months ? ?Uncontrolled type 2 diabetes mellitus with hyperglycemia ([facility]) ?Lab Results  ?Component  Value Date  ? HGBA1C 6.4 (H) 05/03/2021  ?Chronic condition well-controlled on metformin 500 mg twice daily ?Continue current medication  ?Avoid sugar sweets order continue to exercise at least 150 minutes weekly  ?

## 2021-06-23 ENCOUNTER — Telehealth: Payer: Medicare Other

## 2021-06-23 ENCOUNTER — Telehealth: Payer: Self-pay | Admitting: *Deleted

## 2021-06-23 NOTE — Telephone Encounter (Signed)
  Care Management   Follow Up Note   06/23/2021 Name: Jason Oliver MRN: [id number] DOB: 1950/08/01   Referred by: Donell Beers, FNP Reason for referral : Chronic Care Management (DM2, HTN)   A second unsuccessful telephone outreach was attempted today. The patient was referred to the case management team for assistance with care management and care coordination.   Follow Up Plan: Telephone follow up appointment with care management team member scheduled for: upon care guide rescheduling.  [name] [facility], BSN RN Case Manager [facility] [phone]

## 2021-07-21 ENCOUNTER — Telehealth: Payer: Self-pay

## 2021-07-21 NOTE — Chronic Care Management (AMB) (Signed)
  Chronic Care Management Note  07/21/2021 Name: Jason Oliver MRN: [id number] DOB: 19-May-1950  Jason Oliver is a 71 y.o. year old male who is a primary care patient of Paseda, Baird Kay, FNP and is actively engaged with the care management team. I reached out to Jason Oliver by phone today to assist with re-scheduling an initial visit with the RN Case Manager  Follow up plan: The care management team will reach out to the patient again over the next 14 days.  If patient returns call to provider office, please advise to call Embedded Care Management Care Guide Penne Lash  at [phone]  Penne Lash, RMA Care Guide, Embedded Care Coordination [facility]  [place], [place] [address] Direct Dial: [phone] [email or web address]: [email or web address]

## 2021-08-08 ENCOUNTER — Ambulatory Visit: Payer: Self-pay | Admitting: *Deleted

## 2021-08-08 NOTE — Chronic Care Management (AMB) (Signed)
   08/08/2021  Jason Oliver [birth year] [id number]   Unable to maintain contact with patient, case closed.  [name] [facility], BSN RN Case Manager [facility] [phone]

## 2021-08-12 ENCOUNTER — Other Ambulatory Visit: Payer: Self-pay | Admitting: Nurse Practitioner

## 2021-08-12 DIAGNOSIS — I10 Essential (primary) hypertension: Secondary | ICD-10-CM

## 2021-09-18 LAB — HM DIABETES EYE EXAM

## 2021-09-28 LAB — HM DIABETES EYE EXAM

## 2021-11-15 ENCOUNTER — Encounter: Payer: Self-pay | Admitting: Internal Medicine

## 2021-11-15 ENCOUNTER — Ambulatory Visit (INDEPENDENT_AMBULATORY_CARE_PROVIDER_SITE_OTHER): Payer: Medicare Other | Admitting: Internal Medicine

## 2021-11-15 ENCOUNTER — Ambulatory Visit: Payer: Medicare Other | Admitting: Nurse Practitioner

## 2021-11-15 ENCOUNTER — Other Ambulatory Visit: Payer: Self-pay | Admitting: Nurse Practitioner

## 2021-11-15 VITALS — BP 170/80 | HR 77 | Ht 64.0 in | Wt 251.0 lb

## 2021-11-15 DIAGNOSIS — G4733 Obstructive sleep apnea (adult) (pediatric): Secondary | ICD-10-CM | POA: Diagnosis not present

## 2021-11-15 DIAGNOSIS — E1169 Type 2 diabetes mellitus with other specified complication: Secondary | ICD-10-CM

## 2021-11-15 DIAGNOSIS — Z0001 Encounter for general adult medical examination with abnormal findings: Secondary | ICD-10-CM

## 2021-11-15 DIAGNOSIS — E785 Hyperlipidemia, unspecified: Secondary | ICD-10-CM

## 2021-11-15 DIAGNOSIS — Z2821 Immunization not carried out because of patient refusal: Secondary | ICD-10-CM

## 2021-11-15 DIAGNOSIS — Z0000 Encounter for general adult medical examination without abnormal findings: Secondary | ICD-10-CM

## 2021-11-15 DIAGNOSIS — E1165 Type 2 diabetes mellitus with hyperglycemia: Secondary | ICD-10-CM | POA: Diagnosis not present

## 2021-11-15 DIAGNOSIS — I10 Essential (primary) hypertension: Secondary | ICD-10-CM

## 2021-11-15 MED ORDER — CHLORTHALIDONE 25 MG PO TABS: 25.0000 mg | ORAL_TABLET | Freq: Every day | ORAL | 2 refills | Status: DC
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
## 2021-11-15 NOTE — Patient Instructions (Signed)
It was a pleasure to see you today.  Thank you for giving [place] the opportunity to be involved in your care.  Below is a brief recap of your visit and next steps.  We will plan to see you again in 4 weeks.  Summary We have added chlorthalidone 25 mg for blood pressure. Continue your other medications as prescribed. Repeat labs ordered today  Next steps We will follow up in 4 weeks for blood pressure check

## 2021-11-17 ENCOUNTER — Encounter: Payer: Self-pay | Admitting: Internal Medicine

## 2021-11-17 DIAGNOSIS — Z0000 Encounter for general adult medical examination without abnormal findings: Secondary | ICD-10-CM | POA: Insufficient documentation

## 2021-11-17 LAB — LIPID PANEL
Chol/HDL Ratio: 4.9 ratio (ref 0.0–5.0)
Cholesterol, Total: 188 mg/dL (ref 100–199)
HDL: 38 mg/dL — ABNORMAL LOW (ref >39)
LDL Chol Calc (NIH): 134 mg/dL — ABNORMAL HIGH (ref 0–99)
Triglycerides: 86 mg/dL (ref 0–149)
VLDL Cholesterol Cal: 16 mg/dL (ref 5–40)

## 2021-11-17 LAB — CBC WITH DIFFERENTIAL/PLATELET
Basophils Absolute: 0 10*3/uL (ref 0.0–0.2)
Basos: 1 %
EOS (ABSOLUTE): 0.2 10*3/uL (ref 0.0–0.4)
Eos: 4 %
Hematocrit: 39 % (ref 37.5–51.0)
Hemoglobin: 13.3 g/dL (ref 13.0–17.7)
Immature Grans (Abs): 0 10*3/uL (ref 0.0–0.1)
Immature Granulocytes: 0 %
Lymphocytes Absolute: 2.2 10*3/uL (ref 0.7–3.1)
Lymphs: 44 %
MCH: 30.6 pg (ref 26.6–33.0)
MCHC: 34.1 g/dL (ref 31.5–35.7)
MCV: 90 fL (ref 79–97)
Monocytes Absolute: 0.4 10*3/uL (ref 0.1–0.9)
Monocytes: 8 %
Neutrophils Absolute: 2.2 10*3/uL (ref 1.4–7.0)
Neutrophils: 43 %
Platelets: 205 10*3/uL (ref 150–450)
RBC: 4.35 x10E6/uL (ref 4.14–5.80)
RDW: 13.1 % (ref 11.6–15.4)
WBC: 5.1 10*3/uL (ref 3.4–10.8)

## 2021-11-17 LAB — CMP14+EGFR
ALT: 28 IU/L (ref 0–44)
AST: 9 IU/L (ref 0–40)
Albumin/Globulin Ratio: 1.8 (ref 1.2–2.2)
Albumin: 4.6 g/dL (ref 3.9–4.9)
Alkaline Phosphatase: 66 IU/L (ref 44–121)
BUN/Creatinine Ratio: 11 (ref 10–24)
BUN: 11 mg/dL (ref 8–27)
Bilirubin Total: 0.3 mg/dL (ref 0.0–1.2)
CO2: 25 mmol/L (ref 20–29)
Calcium: 9.8 mg/dL (ref 8.6–10.2)
Chloride: 101 mmol/L (ref 96–106)
Creatinine, Ser: 0.96 mg/dL (ref 0.76–1.27)
Globulin, Total: 2.6 g/dL (ref 1.5–4.5)
Glucose: 149 mg/dL — ABNORMAL HIGH (ref 70–99)
Potassium: 4.8 mmol/L (ref 3.5–5.2)
Sodium: 141 mmol/L (ref 134–144)
Total Protein: 7.2 g/dL (ref 6.0–8.5)
eGFR: 86 mL/min/{1.73_m2} (ref >59)

## 2021-11-17 LAB — HEMOGLOBIN A1C
Est. average glucose Bld gHb Est-mCnc: 160 mg/dL
Hgb A1c MFr Bld: 7.2 % — ABNORMAL HIGH (ref 4.8–5.6)

## 2021-11-17 NOTE — Assessment & Plan Note (Signed)
Last A1c 6.4.  He is currently prescribed metformin 500 mg twice daily but states that he has not been taking his medication because he has been told that he cannot eat grapefruit while taking metformin.  With the winter months approaching, he states that he eats grapefruit regularly to boost his immune system. -Repeat A1c today -I have encouraged Jason Oliver to limit his grapefruit consumption and take metformin as prescribed.  We will further discuss this pending A1c results.

## 2021-11-17 NOTE — Assessment & Plan Note (Signed)
Presenting today for follow-up -Repeat A1c ordered -He states that he is followed by [facility] in [place] and has an upcoming appointment -Additional outstanding preventative care items were declined

## 2021-11-17 NOTE — Assessment & Plan Note (Signed)
Wearing CPAP nightly.  This is managed by his neurologist, he will soon retire.  He has been referred to [facility] and plans to establish care in the near future.

## 2021-11-17 NOTE — Assessment & Plan Note (Addendum)
BP 170/80 today.  His current regimen includes amlodipine-olmesartan 10-40 mg daily, hydralazine 25 mg twice daily, and labetalol 100 mg twice daily.  He is not taking clonidine and does not check his blood pressure at home. -Add chlorthalidone 25 mg daily today. -Continue additional medications as prescribed -Follow-up in 4 weeks for BP check

## 2021-11-17 NOTE — Assessment & Plan Note (Signed)
BMI 43.  I reviewed the imperative need to lose weight as his obesity is negatively contributing to his chronic medical conditions.  He expressed understanding.  We reviewed the national recommendation for at least 150 total minutes of moderate intensity exercise on a weekly basis.  We also discussed dietary changes aimed at weight loss such as limiting fatty/fried foods, limiting red meat, eating more fruits, vegetables, and lean proteins.

## 2021-12-13 ENCOUNTER — Encounter: Payer: Self-pay | Admitting: Internal Medicine

## 2021-12-13 ENCOUNTER — Ambulatory Visit (INDEPENDENT_AMBULATORY_CARE_PROVIDER_SITE_OTHER): Payer: Medicare Other | Admitting: Internal Medicine

## 2021-12-13 VITALS — BP 158/78 | HR 79 | Ht 64.0 in | Wt 248.8 lb

## 2021-12-13 DIAGNOSIS — I1A0 Resistant hypertension: Secondary | ICD-10-CM

## 2021-12-13 DIAGNOSIS — G4733 Obstructive sleep apnea (adult) (pediatric): Secondary | ICD-10-CM

## 2021-12-13 DIAGNOSIS — E1169 Type 2 diabetes mellitus with other specified complication: Secondary | ICD-10-CM | POA: Diagnosis not present

## 2021-12-13 DIAGNOSIS — E785 Hyperlipidemia, unspecified: Secondary | ICD-10-CM

## 2021-12-13 MED ORDER — ATORVASTATIN CALCIUM 40 MG PO TABS: 40.0000 mg | ORAL_TABLET | Freq: Every day | ORAL | 3 refills | Status: DC

## 2021-12-13 MED ORDER — SPIRONOLACTONE 25 MG PO TABS: 25.0000 mg | ORAL_TABLET | Freq: Every day | ORAL | 2 refills | Status: DC
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
## 2021-12-13 NOTE — Patient Instructions (Signed)
It was a pleasure to see you today.  Thank you for giving [place] the opportunity to be involved in your care.  Below is a brief recap of your visit and next steps.  We will plan to see you again in 4 weeks.  Summary I have added spironolactone today for blood pressure Atorvastatin has been added for high cholesterol We will check an ultrasound of your kidneys to assess for secondary causes of high blood pressure CPAP supply prescription was sent to [facility]

## 2021-12-18 NOTE — Assessment & Plan Note (Signed)
Presenting today for HTN follow-up.  His blood pressure today is 161/75.  His current antihypertensive regimen includes amlodipine-olmesartan 10-40 mg daily, hydralazine 25 mg twice daily, and labetalol 100 mg twice daily.  Chlorthalidone 25 mg daily was added at his last appointment. -Add spironolactone 25 mg daily today -He is currently prescribed > 3 antihypertensive medications and has not previously undergone a secondary HTN work-up.  I have ordered a renal ultrasound today.

## 2021-12-18 NOTE — Assessment & Plan Note (Signed)
Previously referred to [facility] to establish care.  I have prescribed CPAP supplies today.

## 2021-12-18 NOTE — Assessment & Plan Note (Signed)
Lipid panel updated last month.  Total cholesterol 188, LDL 134.  He was previously prescribed rosuvastatin 20 mg daily but this was discontinued at some point.  His 10-year ASCVD risk score is 47.4%. -Start atorvastatin 40 mg daily

## 2022-01-10 ENCOUNTER — Ambulatory Visit: Payer: Medicare Other | Admitting: Internal Medicine

## 2022-01-17 ENCOUNTER — Ambulatory Visit (INDEPENDENT_AMBULATORY_CARE_PROVIDER_SITE_OTHER): Payer: Medicare Other | Admitting: Internal Medicine

## 2022-01-17 ENCOUNTER — Encounter: Payer: Self-pay | Admitting: Internal Medicine

## 2022-01-17 VITALS — BP 140/72 | HR 80 | Ht 66.0 in | Wt 248.8 lb

## 2022-01-17 DIAGNOSIS — E785 Hyperlipidemia, unspecified: Secondary | ICD-10-CM

## 2022-01-17 DIAGNOSIS — I10 Essential (primary) hypertension: Secondary | ICD-10-CM | POA: Diagnosis not present

## 2022-01-17 DIAGNOSIS — I1A0 Resistant hypertension: Secondary | ICD-10-CM | POA: Diagnosis not present

## 2022-01-17 DIAGNOSIS — E1169 Type 2 diabetes mellitus with other specified complication: Secondary | ICD-10-CM

## 2022-01-17 DIAGNOSIS — G4733 Obstructive sleep apnea (adult) (pediatric): Secondary | ICD-10-CM | POA: Diagnosis not present
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
## 2022-01-17 NOTE — Patient Instructions (Signed)
It was a pleasure to see you today.  Thank you for giving [place] the opportunity to be involved in your care.  Below is a brief recap of your visit and next steps.  We will plan to see you again in 6-8 weeks.  Summary No medication changes today We need to get your renal ultrasound scheduled We will follow up in 6-8 weeks In the interim please check and record your blood pressure 3 times weekly

## 2022-01-18 LAB — CMP14+EGFR
ALT: 26 IU/L (ref 0–44)
AST: 11 IU/L (ref 0–40)
Albumin/Globulin Ratio: 1.8 (ref 1.2–2.2)
Albumin: 4.8 g/dL (ref 3.9–4.9)
Alkaline Phosphatase: 64 IU/L (ref 44–121)
BUN/Creatinine Ratio: 14 (ref 10–24)
BUN: 15 mg/dL (ref 8–27)
Bilirubin Total: 0.4 mg/dL (ref 0.0–1.2)
CO2: 24 mmol/L (ref 20–29)
Calcium: 10.6 mg/dL — ABNORMAL HIGH (ref 8.6–10.2)
Chloride: 98 mmol/L (ref 96–106)
Creatinine, Ser: 1.08 mg/dL (ref 0.76–1.27)
Globulin, Total: 2.7 g/dL (ref 1.5–4.5)
Glucose: 137 mg/dL — ABNORMAL HIGH (ref 70–99)
Potassium: 4.4 mmol/L (ref 3.5–5.2)
Sodium: 137 mmol/L (ref 134–144)
Total Protein: 7.5 g/dL (ref 6.0–8.5)
eGFR: 74 mL/min/{1.73_m2} (ref >59)

## 2022-01-19 ENCOUNTER — Other Ambulatory Visit: Payer: Self-pay | Admitting: Internal Medicine

## 2022-01-19 DIAGNOSIS — I10 Essential (primary) hypertension: Secondary | ICD-10-CM

## 2022-01-25 ENCOUNTER — Encounter (HOSPITAL_COMMUNITY): Payer: Medicare Other

## 2022-01-25 NOTE — Assessment & Plan Note (Signed)
His blood pressure today is 143/68.  He is currently prescribed amlodipine-olmesartan 10-40 mg daily, chlorthalidone 25 mg daily, hydralazine 25 mg twice daily, spironolactone 25 mg daily, and labetalol 100 mg twice daily.  He reports multiple readings since his last appointment with systolic pressures ranging 110-120 mmHg. -No additional medication changes today -Needs to complete previously ordered renal artery ultrasound -Repeat CMP ordered today -Follow-up in 6-8 weeks

## 2022-01-25 NOTE — Assessment & Plan Note (Signed)
Atorvastatin 40 mg daily was started at his appointment in November.  He denies experiencing adverse side effects. -CMP ordered today

## 2022-02-10 ENCOUNTER — Other Ambulatory Visit: Payer: Self-pay | Admitting: Internal Medicine

## 2022-02-10 DIAGNOSIS — I10 Essential (primary) hypertension: Secondary | ICD-10-CM

## 2022-02-22 ENCOUNTER — Ambulatory Visit (INDEPENDENT_AMBULATORY_CARE_PROVIDER_SITE_OTHER): Payer: Medicare Other

## 2022-02-22 VITALS — BP 138/80 | HR 80 | Ht 64.0 in | Wt 252.0 lb

## 2022-02-22 DIAGNOSIS — G4733 Obstructive sleep apnea (adult) (pediatric): Secondary | ICD-10-CM

## 2022-02-22 DIAGNOSIS — Z0000 Encounter for general adult medical examination without abnormal findings: Secondary | ICD-10-CM

## 2022-02-22 DIAGNOSIS — I1A0 Resistant hypertension: Secondary | ICD-10-CM
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
## 2022-02-22 NOTE — Patient Instructions (Signed)
  Jason Oliver , Thank you for taking time to come for your Medicare Wellness Visit. I appreciate your ongoing commitment to your health goals. Please review the following plan we discussed and let me know if I can assist you in the future.   These are the goals we discussed:  Goals      Medication Management     Patient Goals/Self-Care Activities Patient will:  Take medications as prescribed Check blood pressure at least once daily, document, and provide at future appointments Target a minimum of 150 minutes of moderate intensity exercise weekly Engage in dietary modifications by less frequent dining out, decreased fat intake, and fewer sweetened foods & beverages          This is a list of the screening recommended for you and due dates:  Health Maintenance  Topic Date Due   DTaP/Tdap/Td vaccine (1 - Tdap) Never done   Colon Cancer Screening  Never done   Pneumonia Vaccine (1 - PCV) Never done   Flu Shot  04/30/2022*   Zoster (Shingles) Vaccine (1 of 2) 08/03/2022*   Yearly kidney health urinalysis for diabetes  05/04/2022   Complete foot exam   05/04/2022   Hemoglobin A1C  05/17/2022   Eye exam for diabetics  09/29/2022   Yearly kidney function blood test for diabetes  01/18/2023   Medicare Annual Wellness Visit  02/23/2023   Hepatitis C Screening: USPSTF Recommendation to screen - Ages 18-79 yo.  Completed   HPV Vaccine  Aged Out   COVID-19 Vaccine  Discontinued  *Topic was postponed. The date shown is not the original due date.

## 2022-02-28 ENCOUNTER — Encounter: Payer: Self-pay | Admitting: Internal Medicine

## 2022-02-28 ENCOUNTER — Ambulatory Visit (INDEPENDENT_AMBULATORY_CARE_PROVIDER_SITE_OTHER): Payer: Medicare Other | Admitting: Internal Medicine

## 2022-02-28 VITALS — BP 174/93 | HR 72 | Ht 66.0 in | Wt 252.4 lb

## 2022-02-28 DIAGNOSIS — I10 Essential (primary) hypertension: Secondary | ICD-10-CM

## 2022-02-28 DIAGNOSIS — E1169 Type 2 diabetes mellitus with other specified complication: Secondary | ICD-10-CM | POA: Diagnosis not present

## 2022-02-28 DIAGNOSIS — Z5320 Procedure and treatment not carried out because of patient's decision for unspecified reasons: Secondary | ICD-10-CM | POA: Insufficient documentation

## 2022-02-28 DIAGNOSIS — E1165 Type 2 diabetes mellitus with hyperglycemia: Secondary | ICD-10-CM

## 2022-02-28 DIAGNOSIS — E785 Hyperlipidemia, unspecified: Secondary | ICD-10-CM

## 2022-02-28 MED ORDER — LABETALOL HCL 100 MG PO TABS: 100.0000 mg | ORAL_TABLET | Freq: Two times a day (BID) | ORAL | 3 refills | Status: DC

## 2022-02-28 MED ORDER — HYDRALAZINE HCL 25 MG PO TABS: 25.0000 mg | ORAL_TABLET | Freq: Two times a day (BID) | ORAL | 0 refills | Status: DC

## 2022-02-28 NOTE — Assessment & Plan Note (Signed)
Difficult to control HTN.  His current antihypertensive regimen includes amlodipine-olmesartan 10-40 mg daily, chlorthalidone 25 mg daily, hydralazine 25 mg twice daily, labetalol 100 mg twice daily, and spironolactone 25 mg daily.  His blood pressure is significantly elevated today, 170/78 initially, 174/93 on repeat, and 160/80 again when repeated.  He states that he has not taken any of his antihypertensive medications today.  He has been checking his blood pressure routinely at home.  There are multiple readings with systolic pressures decreasing to 90s.  The highest systolic pressure is 825 mmHg. -No medication changes today.  I have asked Jason Oliver to follow-up in 2 weeks and to take his blood pressure medications prior to his appointment.  I am not able to accurately assess his blood pressure today because he has not taken his medications.  I also reviewed with Jason Oliver that he is currently taking 6 antihypertensive medications and that a referral to the advanced HTN clinic as warranted.  He has declined a referral at this time.. -Follow-up in 2 weeks

## 2022-02-28 NOTE — Patient Instructions (Signed)
It was a pleasure to see you today.  Thank you for giving [place] the opportunity to be involved in your care.  Below is a brief recap of your visit and next steps.  We will plan to see you again in 2 weeks.  Summary No medication changes today Please take your blood pressure medicines prior to your next appointment We will repeat labs today Follow up in 2 weeks
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
## 2022-02-28 NOTE — Assessment & Plan Note (Signed)
A1c 7.2 when repeated in October.  He has been taking metformin 500 mg twice daily as prescribed. -Repeat A1c ordered today

## 2022-02-28 NOTE — Assessment & Plan Note (Signed)
Mr. Knightly has again declined to undergo colorectal cancer screening.

## 2022-02-28 NOTE — Assessment & Plan Note (Signed)
Atorvastatin 40 mg daily was started at his appointment in November in the setting of diabetes with LDL 134 and a significantly elevated 10-year ASCVD risk.  He has not experienced any adverse side effects since starting atorvastatin. -Repeat lipid panel ordered today

## 2022-03-01 ENCOUNTER — Other Ambulatory Visit: Payer: Self-pay | Admitting: Internal Medicine

## 2022-03-01 DIAGNOSIS — E1169 Type 2 diabetes mellitus with other specified complication: Secondary | ICD-10-CM

## 2022-03-01 LAB — LIPID PANEL
Chol/HDL Ratio: 5.7 ratio — ABNORMAL HIGH (ref 0.0–5.0)
Cholesterol, Total: 176 mg/dL (ref 100–199)
HDL: 31 mg/dL — ABNORMAL LOW (ref >39)
LDL Chol Calc (NIH): 116 mg/dL — ABNORMAL HIGH (ref 0–99)
Triglycerides: 162 mg/dL — ABNORMAL HIGH (ref 0–149)
VLDL Cholesterol Cal: 29 mg/dL (ref 5–40)

## 2022-03-01 LAB — HEMOGLOBIN A1C
Est. average glucose Bld gHb Est-mCnc: 154 mg/dL
Hgb A1c MFr Bld: 7 % — ABNORMAL HIGH (ref 4.8–5.6)

## 2022-03-01 MED ORDER — ATORVASTATIN CALCIUM 80 MG PO TABS: 80.0000 mg | ORAL_TABLET | Freq: Every day | ORAL | 1 refills | Status: DC

## 2022-03-01 NOTE — Progress Notes (Signed)
Pt returned call

## 2022-03-06 ENCOUNTER — Other Ambulatory Visit: Payer: Self-pay | Admitting: Internal Medicine

## 2022-03-06 DIAGNOSIS — I1A0 Resistant hypertension: Secondary | ICD-10-CM

## 2022-03-16 ENCOUNTER — Ambulatory Visit: Payer: Medicare Other | Admitting: Internal Medicine

## 2022-03-27 ENCOUNTER — Ambulatory Visit: Payer: Medicare Other | Admitting: Internal Medicine

## 2022-03-27 ENCOUNTER — Encounter: Payer: Self-pay | Admitting: Internal Medicine

## 2022-04-12 ENCOUNTER — Other Ambulatory Visit: Payer: Self-pay | Admitting: Internal Medicine

## 2022-04-12 DIAGNOSIS — I10 Essential (primary) hypertension: Secondary | ICD-10-CM

## 2022-04-13 ENCOUNTER — Ambulatory Visit (INDEPENDENT_AMBULATORY_CARE_PROVIDER_SITE_OTHER): Payer: Medicare Other | Admitting: Internal Medicine

## 2022-04-13 ENCOUNTER — Encounter: Payer: Self-pay | Admitting: Internal Medicine

## 2022-04-13 VITALS — BP 157/80 | HR 86 | Ht 64.0 in | Wt 254.0 lb

## 2022-04-13 DIAGNOSIS — I10 Essential (primary) hypertension: Secondary | ICD-10-CM | POA: Diagnosis not present

## 2022-04-13 DIAGNOSIS — E785 Hyperlipidemia, unspecified: Secondary | ICD-10-CM

## 2022-04-13 DIAGNOSIS — G4733 Obstructive sleep apnea (adult) (pediatric): Secondary | ICD-10-CM

## 2022-04-13 DIAGNOSIS — E1169 Type 2 diabetes mellitus with other specified complication: Secondary | ICD-10-CM | POA: Diagnosis not present

## 2022-04-13 DIAGNOSIS — Z5320 Procedure and treatment not carried out because of patient's decision for unspecified reasons: Secondary | ICD-10-CM

## 2022-04-13 MED ORDER — HYDRALAZINE HCL 50 MG PO TABS: 50.0000 mg | ORAL_TABLET | Freq: Three times a day (TID) | ORAL | 2 refills | Status: DC

## 2022-04-13 NOTE — Patient Instructions (Signed)
It was a pleasure to see you today.  Thank you for giving [place] the opportunity to be involved in your care.  Below is a brief recap of your visit and next steps.  We will plan to see you again in 4 weeks.  Summary Increase hydralazine to 50 mg three times daily New referral for sleep medicine has been placed Follow up in 4 weeks for BP check

## 2022-04-13 NOTE — Assessment & Plan Note (Signed)
Presenting today for HTN follow-up.  HTN remains difficult to control.  His blood pressure today was 167/88 initially and 157/80 on repeat.  His current antihypertensive medication regimen includes amlodipine-olmesartan 10-40 mg daily, chlorthalidone 25 mg daily, hydralazine 25 mg twice daily, labetalol 100 mg twice daily, and spironolactone 25 mg daily.  He purchased a new blood pressure machine and his home readings are consistent with what we have seen today.  -Increase hydralazine to 50 mg 3 times daily -He has previously declined a referral to the advanced hypertension clinic -He endorses compliance with CPAP.  New sleep medicine referral placed today for reassessment. -Follow-up in 4 weeks for HTN check
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
## 2022-04-13 NOTE — Assessment & Plan Note (Signed)
Previously documented history of OSA.  This has most recently been managed by neurology (Dr. Merlene Laughter).  He was referred to [facility] following Dr. Freddie Apley retirement, however he has never been contacted to schedule an appointment.  He endorses nightly compliance with CPAP. -New sleep medicine referral placed today

## 2022-05-04 ENCOUNTER — Other Ambulatory Visit: Payer: Self-pay | Admitting: Internal Medicine

## 2022-05-04 DIAGNOSIS — I10 Essential (primary) hypertension: Secondary | ICD-10-CM

## 2022-05-11 ENCOUNTER — Ambulatory Visit: Payer: Medicare Other | Admitting: Internal Medicine

## 2022-05-25 ENCOUNTER — Encounter: Payer: Self-pay | Admitting: Internal Medicine

## 2022-05-25 ENCOUNTER — Ambulatory Visit (INDEPENDENT_AMBULATORY_CARE_PROVIDER_SITE_OTHER): Payer: Medicare Other | Admitting: Internal Medicine

## 2022-05-25 VITALS — BP 159/80 | HR 71 | Ht 66.0 in | Wt 255.4 lb

## 2022-05-25 DIAGNOSIS — I1A0 Resistant hypertension: Secondary | ICD-10-CM | POA: Diagnosis not present

## 2022-05-25 DIAGNOSIS — G4733 Obstructive sleep apnea (adult) (pediatric): Secondary | ICD-10-CM

## 2022-05-25 NOTE — Patient Instructions (Signed)
It was a pleasure to see you today.  Thank you for giving [place] the opportunity to be involved in your care.  Below is a brief recap of your visit and next steps.  We will plan to see you again in 6 weeks.  Summary No medication changes today Advanced Hypertension Clinic referral placed Follow up in 6 weeks for repeat labs
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
## 2022-05-31 NOTE — Assessment & Plan Note (Signed)
Returning to care today for HTN follow-up.  Hydralazine was increased to 50 mg 3 times daily last month.  His blood pressure remains significantly elevated, 173/92 initially and 159/80 on repeat.  He is frustrated at the lack of improvement in his blood pressure readings despite adhering to medication adjustments.  His current antihypertensive regimen includes amlodipine-olmesartan 10-40 mg daily, chlorthalidone 25 mg daily, hydralazine 50 mg 3 times daily, labetalol 100 mg twice daily, and spironolactone 25 mg daily. -Through shared decision making, a referral has been placed to the advanced hypertension clinic for further evaluation and management. -Repeat BMP ordered today -Jason Oliver has declined to make any additional medication changes for now -We will tentatively plan for follow-up in 6 weeks

## 2022-06-04 ENCOUNTER — Other Ambulatory Visit: Payer: Self-pay | Admitting: Internal Medicine

## 2022-06-04 DIAGNOSIS — I1A0 Resistant hypertension: Secondary | ICD-10-CM

## 2022-06-07 ENCOUNTER — Encounter: Payer: Self-pay | Admitting: *Deleted

## 2022-06-28 ENCOUNTER — Other Ambulatory Visit: Payer: Self-pay | Admitting: Internal Medicine

## 2022-06-28 DIAGNOSIS — I10 Essential (primary) hypertension: Secondary | ICD-10-CM

## 2022-07-06 ENCOUNTER — Ambulatory Visit (INDEPENDENT_AMBULATORY_CARE_PROVIDER_SITE_OTHER): Payer: Medicare Other | Admitting: Internal Medicine

## 2022-07-06 ENCOUNTER — Encounter: Payer: Self-pay | Admitting: Internal Medicine

## 2022-07-06 VITALS — BP 170/81 | HR 89 | Ht 66.0 in | Wt 254.4 lb

## 2022-07-06 DIAGNOSIS — E1165 Type 2 diabetes mellitus with hyperglycemia: Secondary | ICD-10-CM

## 2022-07-06 DIAGNOSIS — I10 Essential (primary) hypertension: Secondary | ICD-10-CM | POA: Diagnosis not present

## 2022-07-06 DIAGNOSIS — E1169 Type 2 diabetes mellitus with other specified complication: Secondary | ICD-10-CM | POA: Diagnosis not present

## 2022-07-06 DIAGNOSIS — E785 Hyperlipidemia, unspecified: Secondary | ICD-10-CM | POA: Diagnosis not present

## 2022-07-06 DIAGNOSIS — Z7984 Long term (current) use of oral hypoglycemic drugs: Secondary | ICD-10-CM

## 2022-07-06 NOTE — Patient Instructions (Signed)
It was a pleasure to see you today.  Thank you for giving us the opportunity to be involved in your care.  Below is a brief recap of your visit and next steps.  We will plan to see you again in 3 months.  Summary No medication changes today We will plan for follow up in 3 months Repeat labs ordered today 
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
## 2022-07-06 NOTE — Assessment & Plan Note (Signed)
Returning to care again today for HTN follow-up.  His blood pressure remains significantly elevated, 170/81 initially and 171/98 on repeat.  His current antihypertensive regimen consists of amlodipine-olmesartan 10-40 mg daily, chlorthalidone 25 mg daily, hydralazine 50 mg 3 times daily, labetalol 100 mg twice daily, and spironolactone 25 mg daily.  He was previously referred to the advanced hypertension clinic but was unable to be reached when contacted to schedule an appointment. -Contact information updated today.  I recommended that Mr. Georger contact [facility] to schedule an appointment in the advanced hypertension clinic. -Given his significantly elevated blood pressure readings again today, I recommended discontinuing amlodipine in favor of nifedipine.  I also recommended increasing hydralazine to 100 mg 3 times daily.  He has declined making additional medication adjustments for now until he is able to establish care in the advanced hypertension clinic. -We will tentatively plan for follow-up in 3 months.

## 2022-07-06 NOTE — Assessment & Plan Note (Signed)
A1c 7.0 in January.  He is currently prescribed metformin 500 mg twice daily. -Diabetic foot exam completed today -Urine microalbumin/creatinine ratio ordered -Repeat A1c at follow-up in 3 months

## 2022-07-06 NOTE — Assessment & Plan Note (Signed)
Lipid panel updated in January.  Total cholesterol 176 and LDL 116.  His 10-year ASCVD risk today is 53.7%.  He is currently prescribed atorvastatin 80 mg daily and takes a fish oil supplement as well. -Repeat lipid panel at follow-up in 3 months

## 2022-07-07 LAB — BASIC METABOLIC PANEL
BUN/Creatinine Ratio: 12 (ref 10–24)
BUN: 12 mg/dL (ref 8–27)
CO2: 22 mmol/L (ref 20–29)
Calcium: 10 mg/dL (ref 8.6–10.2)
Chloride: 102 mmol/L (ref 96–106)
Creatinine, Ser: 0.99 mg/dL (ref 0.76–1.27)
Glucose: 198 mg/dL — ABNORMAL HIGH (ref 70–99)
Potassium: 4.6 mmol/L (ref 3.5–5.2)
Sodium: 139 mmol/L (ref 134–144)
eGFR: 82 mL/min/{1.73_m2} (ref >59)

## 2022-07-10 LAB — MICROALBUMIN / CREATININE URINE RATIO
Creatinine, Urine: 101.1 mg/dL
Microalb/Creat Ratio: 98 mg/g creat — ABNORMAL HIGH (ref 0–29)
Microalbumin, Urine: 99.3 ug/mL

## 2022-07-22 ENCOUNTER — Other Ambulatory Visit: Payer: Self-pay | Admitting: Nurse Practitioner

## 2022-07-22 DIAGNOSIS — E1165 Type 2 diabetes mellitus with hyperglycemia: Secondary | ICD-10-CM

## 2022-07-29 ENCOUNTER — Other Ambulatory Visit: Payer: Self-pay | Admitting: Internal Medicine

## 2022-07-29 DIAGNOSIS — I10 Essential (primary) hypertension: Secondary | ICD-10-CM

## 2022-08-08 ENCOUNTER — Institutional Professional Consult (permissible substitution): Payer: Medicare Other | Admitting: Pulmonary Disease

## 2022-08-27 ENCOUNTER — Other Ambulatory Visit: Payer: Self-pay | Admitting: Internal Medicine

## 2022-08-27 DIAGNOSIS — E1169 Type 2 diabetes mellitus with other specified complication: Secondary | ICD-10-CM

## 2022-08-31 ENCOUNTER — Institutional Professional Consult (permissible substitution) (HOSPITAL_BASED_OUTPATIENT_CLINIC_OR_DEPARTMENT_OTHER): Payer: Medicare Other | Admitting: Family

## 2022-09-09 ENCOUNTER — Other Ambulatory Visit: Payer: Self-pay | Admitting: Internal Medicine

## 2022-09-09 DIAGNOSIS — I1A0 Resistant hypertension: Secondary | ICD-10-CM

## 2022-10-04 ENCOUNTER — Encounter: Payer: Self-pay | Admitting: Pulmonary Disease

## 2022-10-04 ENCOUNTER — Ambulatory Visit (INDEPENDENT_AMBULATORY_CARE_PROVIDER_SITE_OTHER): Payer: Medicare Other | Admitting: Pulmonary Disease

## 2022-10-04 VITALS — BP 186/88 | HR 78 | Ht 66.0 in | Wt 256.0 lb

## 2022-10-04 DIAGNOSIS — I10 Essential (primary) hypertension: Secondary | ICD-10-CM | POA: Diagnosis not present

## 2022-10-04 DIAGNOSIS — G4733 Obstructive sleep apnea (adult) (pediatric): Secondary | ICD-10-CM

## 2022-10-04 NOTE — Assessment & Plan Note (Signed)
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  BiPAP 14/9 was required but he seems to have been maintained on CPAP with good results.  Unfortunately I do not have a download available today.  We will request download from his DME [facility].  If he does not have residual events then we will continue CPAP therapy.  He would be eligible for a new machine in 2025. CPAP supplies will be renewed as needed  Weight loss encouraged, compliance with goal of at least 4-6 hrs every night is the expectation. Advised against medications with sedative side effects Cautioned against driving when sleepy - understanding that sleepiness will vary on a day to day basis
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
## 2022-10-04 NOTE — Patient Instructions (Signed)
X Get CPAP report from [employer] [facility] [facility] will be eligible for  anew machine in 2025 - check with them

## 2022-10-04 NOTE — Assessment & Plan Note (Signed)
He does have refractory hypertension requiring 5 medications.  Treatment of OSA would be crucial to support antihypertensives

## 2022-10-05 LAB — HM DIABETES EYE EXAM

## 2022-10-17 ENCOUNTER — Ambulatory Visit (INDEPENDENT_AMBULATORY_CARE_PROVIDER_SITE_OTHER): Payer: Medicare Other | Admitting: Internal Medicine

## 2022-10-17 ENCOUNTER — Encounter: Payer: Self-pay | Admitting: Internal Medicine

## 2022-10-17 VITALS — BP 175/90 | HR 65 | Ht 66.0 in | Wt 253.2 lb

## 2022-10-17 DIAGNOSIS — E1165 Type 2 diabetes mellitus with hyperglycemia: Secondary | ICD-10-CM

## 2022-10-17 DIAGNOSIS — B353 Tinea pedis: Secondary | ICD-10-CM

## 2022-10-17 DIAGNOSIS — Z2821 Immunization not carried out because of patient refusal: Secondary | ICD-10-CM

## 2022-10-17 DIAGNOSIS — G4733 Obstructive sleep apnea (adult) (pediatric): Secondary | ICD-10-CM

## 2022-10-17 DIAGNOSIS — E1169 Type 2 diabetes mellitus with other specified complication: Secondary | ICD-10-CM | POA: Diagnosis not present

## 2022-10-17 DIAGNOSIS — E785 Hyperlipidemia, unspecified: Secondary | ICD-10-CM

## 2022-10-17 DIAGNOSIS — Z7984 Long term (current) use of oral hypoglycemic drugs: Secondary | ICD-10-CM

## 2022-10-17 DIAGNOSIS — I1A0 Resistant hypertension: Secondary | ICD-10-CM

## 2022-10-17 MED ORDER — CICLOPIROX OLAMINE 0.77 % EX CREA
TOPICAL_CREAM | Freq: Two times a day (BID) | CUTANEOUS | 0 refills | Status: DC
Start: 2022-10-17 — End: 2023-04-23

## 2022-10-17 NOTE — Assessment & Plan Note (Signed)
Recently evaluated by pulmonology for OSA follow-up.  He endorses nightly compliance with CPAP.

## 2022-10-17 NOTE — Assessment & Plan Note (Signed)
Lipid panel last updated in January.  Total cholesterol 176 and LDL 116.  He is currently prescribed atorvastatin 80 mg daily and takes a fish oil supplement. -Repeat lipid panel ordered today

## 2022-10-17 NOTE — Patient Instructions (Signed)
It was a pleasure to see you today.  Thank you for giving [place] the opportunity to be involved in your care.  Below is a brief recap of your visit and next steps.  We will plan to see you again in 3 months.  Summary No medication changes today. Foot cream refilled. Repeat labs ordered Follow up in 3 months

## 2022-10-17 NOTE — Assessment & Plan Note (Signed)
Refractory hypertension.  Remains poorly controlled.  He is currently prescribed 5 antihypertensive medications.  He has previously been referred to the advanced hypertension clinic and has an appointment scheduled with Dr. Duke Salvia on 10/2.

## 2022-10-17 NOTE — Assessment & Plan Note (Signed)
Ciclopirox cream refilled today
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
## 2022-10-17 NOTE — Assessment & Plan Note (Signed)
A1c 7.0 on labs from January.  He is currently prescribed metformin 500 mg twice daily.  Moderately increased urine microalbuminuria noted on recent urine study. -Repeat A1c ordered today -Diabetes related preventative care items are up-to-date -We discussed GLP-1 therapy at this would be beneficial for treating diabetes and morbid obesity, which is negatively contributing to other chronic medical conditions.  He again declines GLP-1 treatment today.

## 2022-10-19 ENCOUNTER — Other Ambulatory Visit: Payer: Self-pay | Admitting: Internal Medicine

## 2022-10-19 DIAGNOSIS — E1165 Type 2 diabetes mellitus with hyperglycemia: Secondary | ICD-10-CM

## 2022-10-19 MED ORDER — EZETIMIBE 10 MG PO TABS: 10.0000 mg | ORAL_TABLET | Freq: Every day | ORAL | 3 refills | Status: DC

## 2022-10-19 MED ORDER — METFORMIN HCL 500 MG PO TABS
1000.0000 mg | ORAL_TABLET | Freq: Two times a day (BID) | ORAL | 1 refills | Status: DC
Start: 2022-10-19 — End: 2023-04-23

## 2022-10-24 ENCOUNTER — Other Ambulatory Visit: Payer: Self-pay | Admitting: Internal Medicine

## 2022-10-24 DIAGNOSIS — I10 Essential (primary) hypertension: Secondary | ICD-10-CM

## 2022-10-28 ENCOUNTER — Other Ambulatory Visit: Payer: Self-pay | Admitting: Internal Medicine

## 2022-10-28 DIAGNOSIS — I10 Essential (primary) hypertension: Secondary | ICD-10-CM

## 2022-11-01 ENCOUNTER — Encounter (HOSPITAL_BASED_OUTPATIENT_CLINIC_OR_DEPARTMENT_OTHER): Payer: Self-pay | Admitting: Cardiovascular Disease

## 2022-11-01 ENCOUNTER — Other Ambulatory Visit (HOSPITAL_BASED_OUTPATIENT_CLINIC_OR_DEPARTMENT_OTHER): Payer: Self-pay

## 2022-11-01 ENCOUNTER — Ambulatory Visit (HOSPITAL_BASED_OUTPATIENT_CLINIC_OR_DEPARTMENT_OTHER): Payer: Medicare Other | Admitting: Cardiovascular Disease

## 2022-11-01 VITALS — BP 148/92 | HR 67 | Ht 66.0 in | Wt 252.4 lb

## 2022-11-01 DIAGNOSIS — I1A0 Resistant hypertension: Secondary | ICD-10-CM

## 2022-11-01 DIAGNOSIS — E1165 Type 2 diabetes mellitus with hyperglycemia: Secondary | ICD-10-CM

## 2022-11-01 DIAGNOSIS — E785 Hyperlipidemia, unspecified: Secondary | ICD-10-CM

## 2022-11-01 DIAGNOSIS — G4733 Obstructive sleep apnea (adult) (pediatric): Secondary | ICD-10-CM

## 2022-11-01 DIAGNOSIS — Z006 Encounter for examination for normal comparison and control in clinical research program: Secondary | ICD-10-CM

## 2022-11-01 DIAGNOSIS — E1169 Type 2 diabetes mellitus with other specified complication: Secondary | ICD-10-CM | POA: Diagnosis not present

## 2022-11-01 DIAGNOSIS — Z5181 Encounter for therapeutic drug level monitoring: Secondary | ICD-10-CM

## 2022-11-01 MED ORDER — OLMESARTAN-AMLODIPINE-HCTZ 40-10-25 MG PO TABS: 1.0000 | ORAL_TABLET | Freq: Every day | ORAL | 5 refills | Status: DC
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
## 2022-11-01 NOTE — Patient Instructions (Addendum)
Medication Instructions:  STOP LABETALOL, CHLORTHALIDONE, HYDRALAZINE, AMLODIPINE-OLMESARTAN, AND EZETIMIBE   START OLMESARTAN-AMLODIPINE-hydrochlorothiazide 40-10-25 MG DAILY    Labwork: BMET IN 1 WEEK    Testing/Procedures: NONE   Follow-Up: 1 MONTH WITH PHARM D    You will receive a phone call from the [facility] exercise and nutrition program to schedule an initial assessment.  Special Instructions:  MONITOR YOUR BLOOD PRESSURE TWICE A DAY, LOG IN THE BOOK PROVIDED. BRING THE BOOK AND YOUR BLOOD PRESSURE MACHINE TO YOUR FOLLOW UP IN 1 MONTH   DASH Eating Plan DASH stands for "Dietary Approaches to Stop Hypertension." The DASH eating plan is a healthy eating plan that has been shown to reduce high blood pressure (hypertension). It may also reduce your risk for type 2 diabetes, heart disease, and stroke. The DASH eating plan may also help with weight loss. What are tips for following this plan?  General guidelines Avoid eating more than 2,300 mg (milligrams) of salt (sodium) a day. If you have hypertension, you may need to reduce your sodium intake to 1,500 mg a day. Limit alcohol intake to no more than 1 drink a day for nonpregnant women and 2 drinks a day for men. One drink equals 12 oz of beer, 5 oz of wine, or 1 oz of hard liquor. Work with your health care provider to maintain a healthy body weight or to lose weight. Ask what an ideal weight is for you. Get at least 30 minutes of exercise that causes your heart to beat faster (aerobic exercise) most days of the week. Activities may include walking, swimming, or biking. Work with your health care provider or diet and nutrition specialist (dietitian) to adjust your eating plan to your individual calorie needs. Reading food labels  Check food labels for the amount of sodium per serving. Choose foods with less than 5 percent of the Daily Value of sodium. Generally, foods with less than 300 mg of sodium per serving fit into this eating  plan. To find whole grains, look for the word "whole" as the first word in the ingredient list. Shopping Buy products labeled as "low-sodium" or "no salt added." Buy fresh foods. Avoid canned foods and premade or frozen meals. Cooking Avoid adding salt when cooking. Use salt-free seasonings or herbs instead of table salt or sea salt. Check with your health care provider or pharmacist before using salt substitutes. Do not fry foods. Cook foods using healthy methods such as baking, boiling, grilling, and broiling instead. Cook with heart-healthy oils, such as olive, canola, soybean, or sunflower oil. Meal planning Eat a balanced diet that includes: 5 or more servings of fruits and vegetables each day. At each meal, try to fill half of your plate with fruits and vegetables. Up to 6-8 servings of whole grains each day. Less than 6 oz of lean meat, poultry, or fish each day. A 3-oz serving of meat is about the same size as a deck of cards. One egg equals 1 oz. 2 servings of low-fat dairy each day. A serving of nuts, seeds, or beans 5 times each week. Heart-healthy fats. Healthy fats called Omega-3 fatty acids are found in foods such as flaxseeds and coldwater fish, like sardines, salmon, and mackerel. Limit how much you eat of the following: Canned or prepackaged foods. Food that is high in trans fat, such as fried foods. Food that is high in saturated fat, such as fatty meat. Sweets, desserts, sugary drinks, and other foods with added sugar. Full-fat dairy products. Do  not salt foods before eating. Try to eat at least 2 vegetarian meals each week. Eat more home-cooked food and less restaurant, buffet, and fast food. When eating at a restaurant, ask that your food be prepared with less salt or no salt, if possible. What foods are recommended? The items listed may not be a complete list. Talk with your dietitian about what dietary choices are best for you. Grains Whole-grain or whole-wheat  bread. Whole-grain or whole-wheat pasta. Brown rice. Orpah Cobb. Bulgur. Whole-grain and low-sodium cereals. Pita bread. Low-fat, low-sodium crackers. Whole-wheat flour tortillas. Vegetables Fresh or frozen vegetables (raw, steamed, roasted, or grilled). Low-sodium or reduced-sodium tomato and vegetable juice. Low-sodium or reduced-sodium tomato sauce and tomato paste. Low-sodium or reduced-sodium canned vegetables. Fruits All fresh, dried, or frozen fruit. Canned fruit in natural juice (without added sugar). Meat and other protein foods Skinless chicken or [place]. Ground chicken or [place]. Pork with fat trimmed off. Fish and seafood. Egg whites. Dried beans, peas, or lentils. Unsalted nuts, nut butters, and seeds. Unsalted canned beans. Lean cuts of beef with fat trimmed off. Low-sodium, lean deli meat. Dairy Low-fat (1%) or fat-free (skim) milk. Fat-free, low-fat, or reduced-fat cheeses. Nonfat, low-sodium ricotta or cottage cheese. Low-fat or nonfat yogurt. Low-fat, low-sodium cheese. Fats and oils Soft margarine without trans fats. Vegetable oil. Low-fat, reduced-fat, or light mayonnaise and salad dressings (reduced-sodium). Canola, safflower, olive, soybean, and sunflower oils. Avocado. Seasoning and other foods Herbs. Spices. Seasoning mixes without salt. Unsalted popcorn and pretzels. Fat-free sweets. What foods are not recommended? The items listed may not be a complete list. Talk with your dietitian about what dietary choices are best for you. Grains Baked goods made with fat, such as croissants, muffins, or some breads. Dry pasta or rice meal packs. Vegetables Creamed or fried vegetables. Vegetables in a cheese sauce. Regular canned vegetables (not low-sodium or reduced-sodium). Regular canned tomato sauce and paste (not low-sodium or reduced-sodium). Regular tomato and vegetable juice (not low-sodium or reduced-sodium). Rosita Fire. Olives. Fruits Canned fruit in a light or heavy  syrup. Fried fruit. Fruit in cream or butter sauce. Meat and other protein foods Fatty cuts of meat. Ribs. Fried meat. Tomasa Blase. Sausage. Bologna and other processed lunch meats. Salami. Fatback. Hotdogs. Bratwurst. Salted nuts and seeds. Canned beans with added salt. Canned or smoked fish. Whole eggs or egg yolks. Chicken or [place] with skin. Dairy Whole or 2% milk, cream, and half-and-half. Whole or full-fat cream cheese. Whole-fat or sweetened yogurt. Full-fat cheese. Nondairy creamers. Whipped toppings. Processed cheese and cheese spreads. Fats and oils Butter. Stick margarine. Lard. Shortening. Ghee. Bacon fat. Tropical oils, such as coconut, palm kernel, or palm oil. Seasoning and other foods Salted popcorn and pretzels. Onion salt, garlic salt, seasoned salt, table salt, and sea salt. Worcestershire sauce. Tartar sauce. Barbecue sauce. Teriyaki sauce. Soy sauce, including reduced-sodium. Steak sauce. Canned and packaged gravies. Fish sauce. Oyster sauce. Cocktail sauce. Horseradish that you find on the shelf. Ketchup. Mustard. Meat flavorings and tenderizers. Bouillon cubes. Hot sauce and Tabasco sauce. Premade or packaged marinades. Premade or packaged taco seasonings. Relishes. Regular salad dressings. Where to find more information: National Heart, Lung, and Blood Institute: PopSteam.is American Heart Association: www.heart.org Summary The DASH eating plan is a healthy eating plan that has been shown to reduce high blood pressure (hypertension). It may also reduce your risk for type 2 diabetes, heart disease, and stroke. With the DASH eating plan, you should limit salt (sodium) intake to 2,300 mg a day. If you  have hypertension, you may need to reduce your sodium intake to 1,500 mg a day. When on the DASH eating plan, aim to eat more fresh fruits and vegetables, whole grains, lean proteins, low-fat dairy, and heart-healthy fats. Work with your health care provider or diet and nutrition  specialist (dietitian) to adjust your eating plan to your individual calorie needs. This information is not intended to replace advice given to you by your health care provider. Make sure you discuss any questions you have with your health care provider. Document Released: 01/05/2011 Document Revised: 12/29/2016 Document Reviewed: 01/10/2016 Elsevier Patient Education  2020 [employer].
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
## 2022-11-01 NOTE — Research (Signed)
  Subject Name: Jason Oliver met inclusion and exclusion criteria for the Virtual Care and Social Determinant Interventions for the management of hypertension trial.  The informed consent form, study requirements and expectations were reviewed with the subject by Dr. Duke Salvia and myself. The subject was given the opportunity to read the consent and ask questions. The subject verbalized understanding of the trial requirements.  All questions were addressed prior to the signing of the consent form. The subject agreed to participate in the trial and signed the informed consent. The informed consent was obtained prior to performance of any protocol-specific procedures for the subject.  A copy of the signed informed consent was given to the subject and a copy was placed in the subject's medical record.  [name] was randomized to Group 1.

## 2022-11-28 LAB — BASIC METABOLIC PANEL
BUN/Creatinine Ratio: 10 (ref 10–24)
BUN: 10 mg/dL (ref 8–27)
CO2: 21 mmol/L (ref 20–29)
Calcium: 9.7 mg/dL (ref 8.6–10.2)
Chloride: 100 mmol/L (ref 96–106)
Creatinine, Ser: 1.01 mg/dL (ref 0.76–1.27)
Glucose: 178 mg/dL — ABNORMAL HIGH (ref 70–99)
Potassium: 4.3 mmol/L (ref 3.5–5.2)
Sodium: 139 mmol/L (ref 134–144)
eGFR: 80 mL/min/{1.73_m2} (ref >59)

## 2022-12-07 ENCOUNTER — Ambulatory Visit (HOSPITAL_BASED_OUTPATIENT_CLINIC_OR_DEPARTMENT_OTHER): Payer: Medicare Other | Admitting: Pharmacist Clinician (PhC)/ Clinical Pharmacy Specialist

## 2022-12-07 VITALS — BP 160/82 | HR 76 | Ht 66.0 in | Wt 251.0 lb

## 2022-12-07 DIAGNOSIS — I1A0 Resistant hypertension: Secondary | ICD-10-CM

## 2022-12-07 NOTE — Patient Instructions (Addendum)
Follow up appointment: I will call you in the next week or two to get an appointment set.  Take your BP meds as follows:  continue with current medications  Check your blood pressure at home daily (if able) and keep record of the readings.  Hypertension "High blood pressure"  Hypertension is often called "The Silent Killer." It rarely causes symptoms until it is extremely  high or has done damage to other organs in the body. For this reason, you should have your  blood pressure checked regularly by your physician. We will check your blood pressure  every time you see a provider at one of our offices.   Your blood pressure reading consists of two numbers. Ideally, blood pressure should be  below 120/80. The first ("top") number is called the systolic pressure. It measures the  pressure in your arteries as your heart beats. The second ("bottom") number is called the diastolic pressure. It measures the pressure in your arteries as the heart relaxes between beats.  The benefits of getting your blood pressure under control are enormous. A 10-point  reduction in systolic blood pressure can reduce your risk of stroke by 27% and heart failure by 28%  Your blood pressure goal is < 130/80  To check your pressure at home you will need to:  1. Sit up in a chair, with feet flat on the floor and back supported. Do not cross your ankles or legs. 2. Rest your left arm so that the cuff is about heart level. If the cuff goes on your upper arm,  then just relax the arm on the table, arm of the chair or your lap. If you have a wrist cuff, we  suggest relaxing your wrist against your chest (think of it as Pledging the Flag with the  wrong arm).  3. Place the cuff snugly around your arm, about 1 inch above the crook of your elbow. The  cords should be inside the groove of your elbow.  4. Sit quietly, with the cuff in place, for about 5 minutes. After that 5 minutes press the power  button to start a  reading. 5. Do not talk or move while the reading is taking place.  6. Record your readings on a sheet of paper. Although most cuffs have a memory, it is often  easier to see a pattern developing when the numbers are all in front of you.  7. You can repeat the reading after 1-3 minutes if it is recommended  Make sure your bladder is empty and you have not had caffeine or tobacco within the last 30 min  Always bring your blood pressure log with you to your appointments. If you have not brought your monitor in to be double checked for accuracy, please bring it to your next appointment.  You can find a list of quality blood pressure cuffs at validatebp.org

## 2022-12-07 NOTE — Progress Notes (Unsigned)
Office Visit    Patient Name: Jason Oliver Date of Encounter: 12/10/2022  Primary Care Provider:  Billie Lade, MD Primary Cardiologist:  None  Chief Complaint    Hypertension - Advanced hypertension clinic  Past Medical History   DM2 9/24 A1c 8.0 on metformin 1 gm bid  HLD 9/24 LDL 136 on atorvastatin 80  OSA Compliant with CPAP    Allergies  Allergen Reactions   Novocain [Procaine] Swelling    History of Present Illness    Jason Oliver is a 72 y.o. male patient who was referred to the Advanced Hypertension Clinic by Dr. Christel Mormon MD.  Patient reported first diagnosis in 2001 and is currently frustrated with taking 6 medications.  Readings are still in the 150/90 range.  He has similar frustrations with lipids, on atorvastatin and ezetimibe, LDL still at 136.  He admits to skipping some doses just out of that frustration.  Dr. Duke Oliver simplified his regimen, starting him on Tribenzor 10/40/25 and continuing spironolactone.  She stopped the hydralazine and labetalol for now and he was encourage to be consistent with his medication.  He was agreeable to join our RPM research and randomized to group 1  Today he returns for follow up.  He is very happy to have decreased pill burden at last visit and has been compliant with the new regimen.    Blood Pressure Goal:  130/80  Current Medications: Tribenzor 10/40/25 mg every day, spironolactone 25 mg every day  Previously tried:   labetalol, hydralazine, chlorthalidone - no problems - just needed simplified routine  Family Hx: father living 40 in good health,   mother died heart disease, has 16 siblings, not all living;   Social Hx:      Tobacco:  former - quit in 1990's  Alcohol: none  Caffeine:  1-2 cups coffee, no sodas or sweet tea  Diet:   Regarding his diet he usually steams or blends his meals, has fish maybe twice per weekly. Eating more vegetables and greens, couple boiled eggs daily. Occasionally  completes intermittent fasting. Typically he avoids fried foods and carbs such as potatoes, breads. He doesn't add any salt to his meals   Exercise: occasional walks  Home BP readings:    AM  - 13 readings average 124/70  HR 76   (range 112-136/63-76) PM -  12 readings average 127/73  HR 84   (range 110/135/63-85)  Second reading 134/80  Accessory Clinical Findings    Lab Results  Component Value Date   CREATININE 1.01 11/27/2022   BUN 10 11/27/2022   NA 139 11/27/2022   K 4.3 11/27/2022   CL 100 11/27/2022   CO2 21 11/27/2022   Lab Results  Component Value Date   ALT 26 01/17/2022   AST 11 01/17/2022   ALKPHOS 64 01/17/2022   BILITOT 0.4 01/17/2022   Lab Results  Component Value Date   HGBA1C 8.0 (H) 10/17/2022    Screening for Secondary Hypertension: { Click here to document screening for secondary causes of HTN  :1}     11/01/2022   11:14 AM  Causes  Drugs/Herbals Screened     - Comments limits salt.  1-2 coffees daily.  No EtOH.  Renovascular HTN Screened     - Comments Renal Dopplers neg 01/2022  Sleep Apnea Screened     - Comments uses CPAP  Thyroid Disease Screened  Hyperaldosteronism Not Screened     - Comments check renin/aldo at follow up if BP  remains uncontrolled  Pheochromocytoma N/A  Cushing's Syndrome N/A  Hyperparathyroidism Screened  Coarctation of the Aorta Screened     - Comments BP symmetric  Compliance Screened     - Comments struggles with adherence due to frustration    Relevant Labs/Studies:    Latest Ref Rng & Units 11/27/2022   12:51 PM 07/06/2022   10:17 AM 01/17/2022   11:30 AM  Basic Labs  Sodium 134 - 144 mmol/L 139  139  137   Potassium 3.5 - 5.2 mmol/L 4.3  4.6  4.4   Creatinine 0.76 - 1.27 mg/dL 1.61  0.96  0.45        Latest Ref Rng & Units 02/02/2021    9:59 AM 10/15/2019   12:00 AM  Thyroid   TSH 0.450 - 4.500 uIU/mL 3.460  1.67         This result is from an external source.                02/22/2022    9:15  AM  Renovascular   Renal Artery [place] Completed Yes      Home Medications    Current Outpatient Medications  Medication Sig Dispense Refill   Alpha Lipoic Acid 200 MG CAPS Take by mouth.     atorvastatin (LIPITOR) 80 MG tablet TAKE 1 TABLET BY MOUTH EVERY DAY 90 tablet 1   Cholecalciferol (VITAMIN D3 PO) Take 10,000 Units by mouth daily.     ciclopirox (LOPROX) 0.77 % cream Apply topically 2 (two) times daily. 15 g 0   metFORMIN (GLUCOPHAGE) 500 MG tablet Take 2 tablets (1,000 mg total) by mouth 2 (two) times daily with a meal. 360 tablet 1   Olmesartan-amLODIPine-HCTZ (TRIBENZOR) 40-10-25 MG TABS Take 1 tablet by mouth daily. 30 tablet 5   spironolactone (ALDACTONE) 25 MG tablet TAKE 1 TABLET (25 MG TOTAL) BY MOUTH DAILY. 30 tablet 2   No current facility-administered medications for this visit.     Assessment & Plan    Hypertension Assessment: BP is uncontrolled in office BP 160/82 mmHg;  above the goal (<130/80). Home readings much lower, will need to validate Vivify cuff to determine if white coat hypertension Tolerates current medications well without any side effects Denies SOB, palpitation, chest pain, headaches,or swelling Reiterated the importance of regular exercise and low salt diet   Plan:  Continue taking Tribenzor, spironolactone Patient to keep record of BP readings with heart rate and report to [place] at the next visit Patient to follow up with PharmD in 1 month  Labs ordered today:  none   Jason Oliver PharmD CPP [facility]  [address] [place], [place] [address] [phone]

## 2022-12-10 ENCOUNTER — Encounter (HOSPITAL_BASED_OUTPATIENT_CLINIC_OR_DEPARTMENT_OTHER): Payer: Self-pay | Admitting: Pharmacist Clinician (PhC)/ Clinical Pharmacy Specialist

## 2022-12-10 NOTE — Assessment & Plan Note (Signed)
Assessment: BP is uncontrolled in office BP 160/82 mmHg;  above the goal (<130/80). Home readings much lower, will need to validate Vivify cuff to determine if white coat hypertension Tolerates current medications well without any side effects Denies SOB, palpitation, chest pain, headaches,or swelling Reiterated the importance of regular exercise and low salt diet   Plan:  Continue taking Tribenzor, spironolactone Patient to keep record of BP readings with heart rate and report to [place] at the next visit Patient to follow up with PharmD in 1 month  Labs ordered today:  none

## 2022-12-18 ENCOUNTER — Telehealth (HOSPITAL_BASED_OUTPATIENT_CLINIC_OR_DEPARTMENT_OTHER): Payer: Self-pay | Admitting: Pharmacist Clinician (PhC)/ Clinical Pharmacy Specialist

## 2022-12-18 NOTE — Telephone Encounter (Signed)
Called patient o schedule him a pharm d visit for December 20 th, 2024 but patient stated that Fridays is his religion day. Pharmacist asked if he could come to [facility] any other day

## 2023-01-16 ENCOUNTER — Ambulatory Visit (INDEPENDENT_AMBULATORY_CARE_PROVIDER_SITE_OTHER): Payer: Medicare Other | Admitting: Internal Medicine

## 2023-01-16 ENCOUNTER — Encounter: Payer: Self-pay | Admitting: Internal Medicine

## 2023-01-16 VITALS — BP 138/74 | HR 85 | Ht 66.0 in | Wt 252.0 lb

## 2023-01-16 DIAGNOSIS — E1169 Type 2 diabetes mellitus with other specified complication: Secondary | ICD-10-CM | POA: Diagnosis not present

## 2023-01-16 DIAGNOSIS — I1A0 Resistant hypertension: Secondary | ICD-10-CM | POA: Diagnosis not present

## 2023-01-16 DIAGNOSIS — Z7984 Long term (current) use of oral hypoglycemic drugs: Secondary | ICD-10-CM

## 2023-01-16 DIAGNOSIS — E1165 Type 2 diabetes mellitus with hyperglycemia: Secondary | ICD-10-CM | POA: Diagnosis not present

## 2023-01-16 DIAGNOSIS — E785 Hyperlipidemia, unspecified: Secondary | ICD-10-CM | POA: Diagnosis not present

## 2023-01-16 NOTE — Assessment & Plan Note (Addendum)
Recently evaluated in the advanced HTN clinic.  BP has significantly improved, 138/74 in office today and 114/74 at home this morning.  Medications have been consolidated.  He is currently prescribed Tribenzor and spironolactone.  He will follow-up with clinical pharmacy later this month.

## 2023-01-16 NOTE — Assessment & Plan Note (Signed)
Lipid panel updated in September.  Total cholesterol 191 and LDL 136.  He is currently prescribed atorvastatin 80 mg daily.  It was recently revealed that he had been intermittently compliant with this regimen.  Compliance was reinforced and Zetia was discontinued.  He has been taking atorvastatin as prescribed. -Repeat lipid panel at follow-up in 3 months.

## 2023-01-16 NOTE — Assessment & Plan Note (Signed)
A1c 8.0 on labs from September.  Metformin was increased to 1000 mg twice daily.  He endorses compliance with this regimen most days of the week.  He has previously declined GLP-1 therapy. -Repeat A1c at follow-up in 3 months.  If A1c remains above goal, consider addition of SGLT2 therapy.

## 2023-01-16 NOTE — Patient Instructions (Signed)
It was a pleasure to see you today.  Thank you for giving [place] the opportunity to be involved in your care.  Below is a brief recap of your visit and next steps.  We will plan to see you again in 3 months.  Summary No medication changes today. I am glad to see that things are improving.  Follow up in 3 months
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
## 2023-01-17 ENCOUNTER — Telehealth: Payer: Self-pay | Admitting: Internal Medicine

## 2023-01-17 ENCOUNTER — Other Ambulatory Visit: Payer: Self-pay

## 2023-01-17 DIAGNOSIS — I1A0 Resistant hypertension: Secondary | ICD-10-CM

## 2023-01-17 DIAGNOSIS — E785 Hyperlipidemia, unspecified: Secondary | ICD-10-CM

## 2023-01-17 MED ORDER — SPIRONOLACTONE 25 MG PO TABS: 25.0000 mg | ORAL_TABLET | Freq: Every day | ORAL | 2 refills | Status: DC

## 2023-01-17 MED ORDER — OLMESARTAN-AMLODIPINE-HCTZ 40-10-25 MG PO TABS: 1.0000 | ORAL_TABLET | Freq: Every day | ORAL | 5 refills | Status: DC

## 2023-01-17 MED ORDER — ATORVASTATIN CALCIUM 80 MG PO TABS: 80.0000 mg | ORAL_TABLET | Freq: Every day | ORAL | 1 refills | Status: DC

## 2023-01-17 NOTE — Telephone Encounter (Signed)
Prescription Request  01/17/2023  LOV: 01/16/2023  What is the name of the medication or equipment?  atorvastatin (LIPITOR) 80 MG tablet [[id number]]    spironolactone (ALDACTONE) 25 MG tablet [[id number]    Olmesartan-amLODIPine-HCTZ (TRIBENZOR) 40-10-25 MG TABS   Have you contacted your pharmacy to request a refill? Yes   Which pharmacy would you like this sent to?  [facility]    Patient notified that their request is being sent to the clinical staff for review and that they should receive a response within 2 business days.   Please advise at  walked into office

## 2023-01-17 NOTE — Telephone Encounter (Signed)
Refills sent to pharmacy. 

## 2023-01-25 ENCOUNTER — Other Ambulatory Visit: Payer: Self-pay | Admitting: Internal Medicine

## 2023-01-25 DIAGNOSIS — I10 Essential (primary) hypertension: Secondary | ICD-10-CM

## 2023-01-26 ENCOUNTER — Other Ambulatory Visit: Payer: Self-pay | Admitting: Internal Medicine

## 2023-01-26 DIAGNOSIS — I10 Essential (primary) hypertension: Secondary | ICD-10-CM

## 2023-03-27 ENCOUNTER — Ambulatory Visit (INDEPENDENT_AMBULATORY_CARE_PROVIDER_SITE_OTHER): Payer: Medicare Other

## 2023-03-27 VITALS — Ht 67.0 in | Wt 252.0 lb

## 2023-03-27 DIAGNOSIS — Z0000 Encounter for general adult medical examination without abnormal findings: Secondary | ICD-10-CM
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
## 2023-03-27 NOTE — Patient Instructions (Signed)
 Jason Oliver , Thank you for taking time to come for your Medicare Wellness Visit. I appreciate your ongoing commitment to your health goals. Please review the following plan we discussed and let me know if I can assist you in the future.   Referrals/Orders/Follow-Ups/Clinician Recommendations:  Next Medicare Annual Wellness Visit:   marcy 2, 2026 at 10:00 am telephone visit.      This is a list of the screening recommended for you and due dates:  Health Maintenance  Topic Date Due   DTaP/Tdap/Td vaccine (1 - Tdap) Never done   Zoster (Shingles) Vaccine (1 of 2) Never done   Colon Cancer Screening  04/13/2023*   Flu Shot  04/30/2023*   Pneumonia Vaccine (1 of 2 - PCV) 10/17/2023*   Hemoglobin A1C  04/16/2023   Yearly kidney health urinalysis for diabetes  07/06/2023   Complete foot exam   07/06/2023   Eye exam for diabetics  10/05/2023   Yearly kidney function blood test for diabetes  11/27/2023   Medicare Annual Wellness Visit  03/26/2024   Hepatitis C Screening  Completed   HPV Vaccine  Aged Out   COVID-19 Vaccine  Discontinued  *Topic was postponed. The date shown is not the original due date.    Advanced directives: (Declined) Advance directive discussed with you today. Even though you declined this today, please call our office should you change your mind, and we can give you the proper paperwork for you to fill out.  Next Medicare Annual Wellness Visit scheduled for next year: yes  Understanding Your Risk for Falls Millions of people have serious injuries from falls each year. It is important to understand your risk of falling. Talk with your health care provider about your risk and what you can do to lower it. If you do have a serious fall, make sure to tell your provider. Falling once raises your risk of falling again. How can falls affect me? Serious injuries from falls are common. These include: Broken bones, such as hip fractures. Head injuries, such as traumatic brain  injuries (TBI) or concussions. A fear of falling can cause you to avoid activities and stay at home. This can make your muscles weaker and raise your risk for a fall. What can increase my risk? There are a number of risk factors that increase your risk for falling. The more risk factors you have, the higher your risk of falling. Serious injuries from a fall happen most often to people who are older than 71 years old. Teenagers and young adults ages 38-29 are also at higher risk. Common risk factors include: Weakness in the lower body. Being generally weak or confused due to long-term (chronic) illness. Dizziness or balance problems. Poor vision. Medicines that cause dizziness or drowsiness. These may include: Medicines for your blood pressure, heart, anxiety, insomnia, or swelling (edema). Pain medicines. Muscle relaxants. Other risk factors include: Drinking alcohol. Having had a fall in the past. Having foot pain or wearing improper footwear. Working at a dangerous job. Having any of the following in your home: Tripping hazards, such as floor clutter or loose rugs. Poor lighting. Pets. Having dementia or memory loss. What actions can I take to lower my risk of falling?     Physical activity Stay physically fit. Do strength and balance exercises. Consider taking a regular class to build strength and balance. Yoga and tai chi are good options. Vision Have your eyes checked every year and your prescription for glasses or contacts updated as  needed. Shoes and walking aids Wear non-skid shoes. Wear shoes that have rubber soles and low heels. Do not wear high heels. Do not walk around the house in socks or slippers. Use a cane or walker as told by your provider. Home safety Attach secure railings on both sides of your stairs. Install grab bars for your bathtub, shower, and toilet. Use a non-skid mat in your bathtub or shower. Attach bath mats securely with double-sided, non-slip  rug tape. Use good lighting in all rooms. Keep a flashlight near your bed. Make sure there is a clear path from your bed to the bathroom. Use night-lights. Do not use throw rugs. Make sure all carpeting is taped or tacked down securely. Remove all clutter from walkways and stairways, including extension cords. Repair uneven or broken steps and floors. Avoid walking on icy or slippery surfaces. Walk on the grass instead of on icy or slick sidewalks. Use ice melter to get rid of ice on walkways in the winter. Use a cordless phone. Questions to ask your health care provider Can you help me check my risk for a fall? Do any of my medicines make me more likely to fall? Should I take a vitamin D supplement? What exercises can I do to improve my strength and balance? Should I make an appointment to have my vision checked? Do I need a bone density test to check for weak bones (osteoporosis)? Would it help to use a cane or a walker? Where to find more information Centers for Disease Control and Prevention, STEADI: TonerPromos.no Community-Based Fall Prevention Programs: TonerPromos.no [employer] on Aging: BaseRingTones.pl Contact a health care provider if: You fall at home. You are afraid of falling at home. You feel weak, drowsy, or dizzy. This information is not intended to replace advice given to you by your health care provider. Make sure you discuss any questions you have with your health care provider. Document Revised: 09/19/2021 Document Reviewed: 09/19/2021 Elsevier Patient Education  2024 [employer].

## 2023-04-16 ENCOUNTER — Ambulatory Visit: Payer: Medicare Other | Admitting: Adult Health

## 2023-04-16 ENCOUNTER — Encounter: Payer: Self-pay | Admitting: Adult Health

## 2023-04-16 VITALS — BP 184/74 | HR 74 | Ht 67.0 in | Wt 262.0 lb

## 2023-04-16 DIAGNOSIS — G4733 Obstructive sleep apnea (adult) (pediatric): Secondary | ICD-10-CM | POA: Diagnosis not present

## 2023-04-16 DIAGNOSIS — I10 Essential (primary) hypertension: Secondary | ICD-10-CM | POA: Diagnosis not present

## 2023-04-16 DIAGNOSIS — E669 Obesity, unspecified: Secondary | ICD-10-CM

## 2023-04-16 DIAGNOSIS — Z6841 Body Mass Index (BMI) 40.0 and over, adult: Secondary | ICD-10-CM

## 2023-04-16 NOTE — Patient Instructions (Signed)
 Continue on CPAP At bedtime, wear all night long .  Do not drive if sleepy  Work on healthy weight loss  CPAP download requested.  Follow up in 1 year and As needed

## 2023-04-16 NOTE — Progress Notes (Signed)
 @Patient  ID: Jason Oliver, male    DOB: [birth year], 73 y.o.   MRN: [id number]  Chief Complaint  Patient presents with   Follow-up   Discussed the use of AI scribe software for clinical note transcription with the patient, who gave verbal consent to proceed.  Referring provider: Billie Lade, MD  HPI: 73 yo male seen for sleep consult 10/2022 to establish for sleep apnea.  Medical history significant for HTN and DM   TEST/EVENTS :  NPSG 01/2018 -AHI 62.6/hr , SPo2 79%  04/16/2023 Follow up ; OSA Jason Oliver is a 73 year old male with severe sleep apnea who presents for a six-month follow-up. Patient has been on CPAP for last 5 years. Says he wears CPAP each night and benefits from CPAP. We have requested download from his DME-[facility]. Unable to see what CPAP setting he is on. He says he needs a new CPAP as his CPAP is getting old.  He uses a nasal mask with his CPAP machine.  Usually gets in 6hr or more of CPAP usage each night.      Allergies  Allergen Reactions   Novocain [Procaine] Swelling    Immunization History  Administered Date(s) Administered   Moderna Sars-Covid-2 Vaccination 09/11/2019, 10/09/2019    Past Medical History:  Diagnosis Date   Diabetes mellitus without complication (HCC)    Fracture    Hepatitis    Hypertension    Sleep apnea     Tobacco History: Social History   Tobacco Use  Smoking Status Former   Types: Cigarettes  Smokeless Tobacco Never  Tobacco Comments   He quit smoking in the 90's, started smoking in the teenage years , smoked like 4 sticks per day then.    Counseling given: Not Answered Tobacco comments: He quit smoking in the 90's, started smoking in the teenage years , smoked like 4 sticks per day then.    Outpatient Medications Prior to Visit  Medication Sig Dispense Refill   Alpha Lipoic Acid 200 MG CAPS Take by mouth.     atorvastatin (LIPITOR) 80 MG tablet Take 1 tablet (80 mg total) by mouth  daily. 90 tablet 1   Cholecalciferol (VITAMIN D3 PO) Take 10,000 Units by mouth daily.     ciclopirox (LOPROX) 0.77 % cream Apply topically 2 (two) times daily. 15 g 0   metFORMIN (GLUCOPHAGE) 500 MG tablet Take 2 tablets (1,000 mg total) by mouth 2 (two) times daily with a meal. 360 tablet 1   Olmesartan-amLODIPine-HCTZ (TRIBENZOR) 40-10-25 MG TABS Take 1 tablet by mouth daily. 30 tablet 5   spironolactone (ALDACTONE) 25 MG tablet Take 1 tablet (25 mg total) by mouth daily. 30 tablet 2   No facility-administered medications prior to visit.     Review of Systems:   Constitutional:   No  weight loss, night sweats,  Fevers, chills, +fatigue, or  lassitude.  HEENT:   No headaches,  Difficulty swallowing,  Tooth/dental problems, or  Sore throat,                No sneezing, itching, ear ache, nasal congestion, post nasal drip,   CV:  No chest pain,  Orthopnea, PND, swelling in lower extremities, anasarca, dizziness, palpitations, syncope.   GI  No heartburn, indigestion, abdominal pain, nausea, vomiting, diarrhea, change in bowel habits, loss of appetite, bloody stools.   Resp: .  No chest wall deformity  Skin: no rash or lesions.  GU: no dysuria, change in color of urine,  no urgency or frequency.  No flank pain, no hematuria   MS:  No joint pain or swelling.  No decreased range of motion.  No back pain.    Physical Exam  BP (!) 184/74 Comment: pt didn't take medication  Pulse 74   Ht 5\' 7"  (1.702 m)   Wt 262 lb (118.8 kg)   SpO2 94% Comment: room air  BMI 41.04 kg/m   GEN: A/Ox3; pleasant , NAD, well nourished    HEENT:  [place]/AT,  NOSE-clear, THROAT-clear, no lesions, no postnasal drip or exudate noted. Class 3-4 MP airway   NECK:  Supple w/ fair ROM; no JVD; normal carotid impulses w/o bruits; no thyromegaly or nodules palpated; no lymphadenopathy.    RESP  Clear  P & A; w/o, wheezes/ rales/ or rhonchi. no accessory muscle use, no dullness to percussion  CARD:  RRR, no  m/r/g, tr peripheral edema, pulses intact, no cyanosis or clubbing.  GI:   Soft & nt; nml bowel sounds; no organomegaly or masses detected.   Musco: Warm bil, no deformities or joint swelling noted.   Neuro: alert, no focal deficits noted.    Skin: Warm, no lesions or rashes    Lab Results:  CBC  BNP No results found for: "BNP"  ProBNP No results found for: "PROBNP"  Imaging: No results found.  Administration History     None           No data to display          No results found for: "NITRICOXIDE"      Assessment & Plan:  Assessment and Plan    Obstructive Sleep Apnea (OSA)   He has severe OSA with a previous sleep study showing 62 apneic episodes per hour. He uses a CPAP machine nightly, which improves symptoms with perceived benefit. The CPAP machine is greater than five years old and needs replacement. Weight loss discussed.  Contact [facility] for CPAP Copywriter, advertising. If data download is not obtained, instruct him to take the CPAP machine to [facility]. Ensure documentation of CPAP usage and effectiveness before ordering a new machine. Order for new CPAP once we see the current setting of  CPAP .   Hypertension   Continue current regimen and follow up with PCP.   Obesity - work on healthy weight loss.           Jason Oaks, NP 04/16/2023

## 2023-04-22 ENCOUNTER — Other Ambulatory Visit: Payer: Self-pay | Admitting: Internal Medicine

## 2023-04-22 DIAGNOSIS — E1169 Type 2 diabetes mellitus with other specified complication: Secondary | ICD-10-CM

## 2023-04-23 ENCOUNTER — Encounter: Payer: Self-pay | Admitting: Internal Medicine

## 2023-04-23 ENCOUNTER — Telehealth: Payer: Self-pay | Admitting: Adult Health

## 2023-04-23 ENCOUNTER — Telehealth: Payer: Self-pay

## 2023-04-23 ENCOUNTER — Ambulatory Visit (INDEPENDENT_AMBULATORY_CARE_PROVIDER_SITE_OTHER): Payer: Medicare Other | Admitting: Internal Medicine

## 2023-04-23 DIAGNOSIS — E1165 Type 2 diabetes mellitus with hyperglycemia: Secondary | ICD-10-CM

## 2023-04-23 DIAGNOSIS — G4733 Obstructive sleep apnea (adult) (pediatric): Secondary | ICD-10-CM

## 2023-04-23 DIAGNOSIS — I1A0 Resistant hypertension: Secondary | ICD-10-CM | POA: Diagnosis not present

## 2023-04-23 DIAGNOSIS — Z6841 Body Mass Index (BMI) 40.0 and over, adult: Secondary | ICD-10-CM

## 2023-04-23 DIAGNOSIS — E1169 Type 2 diabetes mellitus with other specified complication: Secondary | ICD-10-CM

## 2023-04-23 DIAGNOSIS — E785 Hyperlipidemia, unspecified: Secondary | ICD-10-CM

## 2023-04-23 DIAGNOSIS — Z5320 Procedure and treatment not carried out because of patient's decision for unspecified reasons: Secondary | ICD-10-CM

## 2023-04-23 DIAGNOSIS — Z7984 Long term (current) use of oral hypoglycemic drugs: Secondary | ICD-10-CM

## 2023-04-23 MED ORDER — SPIRONOLACTONE 25 MG PO TABS: 25.0000 mg | ORAL_TABLET | Freq: Every day | ORAL | 2 refills | Status: AC

## 2023-04-23 MED ORDER — METFORMIN HCL 500 MG PO TABS: 1000.0000 mg | ORAL_TABLET | Freq: Two times a day (BID) | ORAL | 1 refills | Status: DC

## 2023-04-23 MED ORDER — OLMESARTAN-AMLODIPINE-HCTZ 40-10-25 MG PO TABS: 1.0000 | ORAL_TABLET | Freq: Every day | ORAL | 5 refills | Status: DC
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
## 2023-04-23 NOTE — Assessment & Plan Note (Signed)
 Severe OSA.  Seen by pulmonology for follow-up last week.  He is currently trying to get a new CPAP machine.

## 2023-04-23 NOTE — Assessment & Plan Note (Signed)
 Documented history of resistant hypertension.  He has previously been referred to the advanced HTN clinic.  Current antihypertensive regimen includes Tribenzor and spironolactone.  He has previously been well-controlled on this regimen but his blood pressure is elevated today.  He states that he is not take his medications every day.  He is not checking his blood pressure at home currently. -Medications refilled today.  The importance of compliance was reinforced.  He is due for follow-up in the advanced HTN clinic.  We will coordinate follow-up.

## 2023-04-23 NOTE — Telephone Encounter (Signed)
 Per PCP patient is requesting new CPAP machine.  LOV 04/16/23: Assessment and Plan Obstructive Sleep Apnea (OSA)   He has severe OSA with a previous sleep study showing 62 apneic episodes per hour. He uses a CPAP machine nightly, which improves symptoms with perceived benefit. The CPAP machine is greater than five years old and needs replacement. Weight loss discussed.  Contact [employer] for CPAP Copywriter, advertising. If data download is not obtained, instruct him to take the CPAP machine to [facility]. Ensure documentation of CPAP usage and effectiveness before ordering a new machine. Order for new CPAP once we see the current setting of  CPAP  No media in chart related to CPAP download/settings.  Contacted [facility]  LVM for Burna Mortimer asking for [employer] and current settings.

## 2023-04-23 NOTE — Assessment & Plan Note (Signed)
 A1c 8.0 on labs from September 2024.  He is currently prescribed metformin 1000 mg twice daily.  He takes metformin inconsistently.  Not checking his blood sugar regularly. -Repeat A1c ordered today

## 2023-04-23 NOTE — Telephone Encounter (Signed)
 BiPAP download shows excellent compliance with 100% usage.  Daily average usage at 7 hours.  Patient is on BiPAP and IPAP max 14 cm H2O.  EPAP minimum 9 cm H2O. Needs order for new BiPAP order sent to [facility]

## 2023-04-23 NOTE — Assessment & Plan Note (Signed)
 He is currently prescribed atorvastatin 80 mg daily.  Lipid panel last updated in September 2024.  Total cholesterol 191 and LDL 136. -Repeat lipid panel ordered today

## 2023-04-23 NOTE — Telephone Encounter (Signed)
 Order already sent by provider.  Nothing further needed.

## 2023-04-23 NOTE — Telephone Encounter (Signed)
 Report available in [facility] Order for new BiPAP placed with current settings.

## 2023-04-23 NOTE — Patient Instructions (Signed)
 It was a pleasure to see you today.  Thank you for giving [place] the opportunity to be involved in your care.  Below is a brief recap of your visit and next steps.  We will plan to see you again in 3 months.  Summary No medication changes today Repeat labs ordered Follow up in 3 months

## 2023-04-24 ENCOUNTER — Other Ambulatory Visit: Payer: Self-pay | Admitting: Internal Medicine

## 2023-04-24 DIAGNOSIS — E1165 Type 2 diabetes mellitus with hyperglycemia: Secondary | ICD-10-CM

## 2023-04-24 LAB — LIPID PANEL

## 2023-04-24 MED ORDER — EMPAGLIFLOZIN 10 MG PO TABS: 10.0000 mg | ORAL_TABLET | Freq: Every day | ORAL | 3 refills | Status: DC

## 2023-04-25 LAB — CBC WITH DIFFERENTIAL/PLATELET
Basophils Absolute: 0 10*3/uL (ref 0.0–0.2)
Basos: 1 %
EOS (ABSOLUTE): 0.2 10*3/uL (ref 0.0–0.4)
Eos: 4 %
Hematocrit: 39 % (ref 37.5–51.0)
Hemoglobin: 13.2 g/dL (ref 13.0–17.7)
Immature Grans (Abs): 0 10*3/uL (ref 0.0–0.1)
Immature Granulocytes: 0 %
Lymphocytes Absolute: 2 10*3/uL (ref 0.7–3.1)
Lymphs: 42 %
MCH: 30.8 pg (ref 26.6–33.0)
MCHC: 33.8 g/dL (ref 31.5–35.7)
MCV: 91 fL (ref 79–97)
Monocytes Absolute: 0.4 10*3/uL (ref 0.1–0.9)
Monocytes: 9 %
Neutrophils Absolute: 2.2 10*3/uL (ref 1.4–7.0)
Neutrophils: 44 %
Platelets: 214 10*3/uL (ref 150–450)
RBC: 4.28 x10E6/uL (ref 4.14–5.80)
RDW: 12.5 % (ref 11.6–15.4)
WBC: 4.8 10*3/uL (ref 3.4–10.8)

## 2023-04-25 LAB — BASIC METABOLIC PANEL
BUN/Creatinine Ratio: 9 — ABNORMAL LOW (ref 10–24)
BUN: 9 mg/dL (ref 8–27)
CO2: 22 mmol/L (ref 20–29)
Calcium: 9.8 mg/dL (ref 8.6–10.2)
Chloride: 103 mmol/L (ref 96–106)
Creatinine, Ser: 1 mg/dL (ref 0.76–1.27)
Glucose: 202 mg/dL — ABNORMAL HIGH (ref 70–99)
Potassium: 4.3 mmol/L (ref 3.5–5.2)
Sodium: 139 mmol/L (ref 134–144)
eGFR: 80 mL/min/{1.73_m2} (ref >59)

## 2023-04-25 LAB — TSH+FREE T4
Free T4: 1 ng/dL (ref 0.82–1.77)
TSH: 2.79 u[IU]/mL (ref 0.450–4.500)

## 2023-04-25 LAB — LIPID PANEL
Cholesterol, Total: 167 mg/dL (ref 100–199)
HDL: 32 mg/dL — ABNORMAL LOW (ref >39)
LDL CALC COMMENT:: 5.2 ratio — ABNORMAL HIGH (ref 0.0–5.0)
LDL Chol Calc (NIH): 112 mg/dL — ABNORMAL HIGH (ref 0–99)
Triglycerides: 128 mg/dL (ref 0–149)
VLDL Cholesterol Cal: 23 mg/dL (ref 5–40)

## 2023-04-25 LAB — VITAMIN D 25 HYDROXY (VIT D DEFICIENCY, FRACTURES): Vit D, 25-Hydroxy: 35.3 ng/mL (ref 30.0–100.0)

## 2023-04-25 LAB — HEMOGLOBIN A1C
Est. average glucose Bld gHb Est-mCnc: 223 mg/dL
Hgb A1c MFr Bld: 9.4 % — ABNORMAL HIGH (ref 4.8–5.6)

## 2023-05-03 ENCOUNTER — Telehealth: Payer: Self-pay | Admitting: Adult Health

## 2023-05-03 NOTE — Telephone Encounter (Signed)
 Disregard

## 2023-05-16 ENCOUNTER — Other Ambulatory Visit: Payer: Self-pay | Admitting: *Deleted

## 2023-05-16 DIAGNOSIS — G4733 Obstructive sleep apnea (adult) (pediatric): Secondary | ICD-10-CM

## 2023-05-28 ENCOUNTER — Other Ambulatory Visit: Payer: Self-pay | Admitting: *Deleted

## 2023-05-28 DIAGNOSIS — G4733 Obstructive sleep apnea (adult) (pediatric): Secondary | ICD-10-CM

## 2023-07-24 ENCOUNTER — Ambulatory Visit (INDEPENDENT_AMBULATORY_CARE_PROVIDER_SITE_OTHER)

## 2023-07-24 VITALS — BP 132/84 | HR 96 | Ht 66.0 in | Wt 256.1 lb

## 2023-07-24 DIAGNOSIS — I1A0 Resistant hypertension: Secondary | ICD-10-CM | POA: Diagnosis not present

## 2023-07-24 DIAGNOSIS — E1165 Type 2 diabetes mellitus with hyperglycemia: Secondary | ICD-10-CM | POA: Diagnosis not present

## 2023-07-24 NOTE — Progress Notes (Unsigned)
   Established Patient Office Visit  Subjective   Patient ID: Jason Oliver, male    DOB: [birth year]  Age: 73 y.o. MRN: [id number]  Chief Complaint  Patient presents with   Medical Management of Chronic Issues    Pt here for 3 month follow up    HPI  {History (Optional):23778}  ROS    Objective:     BP (!) 164/94   Pulse 96   Ht 5' 6 (1.676 m)   Wt 256 lb 1.9 oz (116.2 kg)   SpO2 97%   BMI 41.34 kg/m  {Vitals History (Optional):23777}  Physical Exam   No results found for any visits on 07/24/23.  {Labs (Optional):23779}  The 10-year ASCVD risk score (Arnett DK, et al., 2019) is: 51.6%    Assessment & Plan:   Problem List Items Addressed This Visit   None   No follow-ups on file.    Leita Longs, FNP

## 2023-07-25 LAB — CMP14+EGFR
ALT: 61 IU/L — ABNORMAL HIGH (ref 0–44)
AST: 22 IU/L (ref 0–40)
Albumin: 4.6 g/dL (ref 3.8–4.8)
Alkaline Phosphatase: 71 IU/L (ref 44–121)
BUN/Creatinine Ratio: 11 (ref 10–24)
BUN: 12 mg/dL (ref 8–27)
Bilirubin Total: 0.4 mg/dL (ref 0.0–1.2)
CO2: 22 mmol/L (ref 20–29)
Calcium: 10 mg/dL (ref 8.6–10.2)
Chloride: 101 mmol/L (ref 96–106)
Creatinine, Ser: 1.05 mg/dL (ref 0.76–1.27)
Globulin, Total: 2.7 g/dL (ref 1.5–4.5)
Glucose: 233 mg/dL — ABNORMAL HIGH (ref 70–99)
Potassium: 4.4 mmol/L (ref 3.5–5.2)
Sodium: 138 mmol/L (ref 134–144)
Total Protein: 7.3 g/dL (ref 6.0–8.5)
eGFR: 76 mL/min/{1.73_m2} (ref >59)

## 2023-07-25 LAB — HEMOGLOBIN A1C
Est. average glucose Bld gHb Est-mCnc: 194 mg/dL
Hgb A1c MFr Bld: 8.4 % — ABNORMAL HIGH (ref 4.8–5.6)

## 2023-07-25 NOTE — Assessment & Plan Note (Signed)
 Documented history of resistant hypertension.  He has previously been referred to the advanced HTN clinic.  Current antihypertensive regimen includes Tribenzor and spironolactone .  He reports good control on this regimen but his blood pressure was elevated today initially, but then improved when manually rechecked.  He states that he is taking his medications every day.  -Medications refilled today.  The importance of compliance was reinforced.   Recommend f/u in 4 months or sooner if BP remains above 150/90.

## 2023-07-25 NOTE — Assessment & Plan Note (Signed)
 A1C from 04/23/23 was 9.4.   He is currently prescribed metformin  1000 mg twice daily.  He takes metformin  inconsistently.  Not checking his blood sugar regularly. -Repeat A1c ordered today

## 2023-08-19 ENCOUNTER — Ambulatory Visit: Payer: Self-pay

## 2023-09-08 ENCOUNTER — Other Ambulatory Visit: Payer: Self-pay | Admitting: Internal Medicine

## 2023-09-08 DIAGNOSIS — E1165 Type 2 diabetes mellitus with hyperglycemia: Secondary | ICD-10-CM

## 2023-10-24 ENCOUNTER — Ambulatory Visit

## 2023-10-24 VITALS — BP 189/100 | HR 84 | Ht 66.0 in | Wt 256.0 lb

## 2023-10-24 DIAGNOSIS — G4733 Obstructive sleep apnea (adult) (pediatric): Secondary | ICD-10-CM

## 2023-10-24 DIAGNOSIS — E1165 Type 2 diabetes mellitus with hyperglycemia: Secondary | ICD-10-CM | POA: Diagnosis not present

## 2023-10-24 DIAGNOSIS — E785 Hyperlipidemia, unspecified: Secondary | ICD-10-CM

## 2023-10-24 DIAGNOSIS — I1A0 Resistant hypertension: Secondary | ICD-10-CM | POA: Diagnosis not present

## 2023-10-24 DIAGNOSIS — E1169 Type 2 diabetes mellitus with other specified complication: Secondary | ICD-10-CM | POA: Diagnosis not present

## 2023-10-24 NOTE — Progress Notes (Signed)
 Established Patient Office Visit  Subjective   Patient ID: Jason Oliver, male    DOB: 1950/03/06  Age: 73 y.o. MRN: [id number]  Chief Complaint  Patient presents with   Hypertension    Three month follow up     HPI Discussed the use of AI scribe software for clinical note transcription with the patient, who gave verbal consent to proceed.  History of Present Illness   Jason Oliver is a 73 year old male with hypertension and diabetes who presents for blood pressure management and follow-up.  Hypertension - Consistently elevated blood pressure readings, both with and without antihypertensive medication - Did not take blood pressure medication this morning; typically takes medication at various times during the day - Perceives minimal effect of medication on blood pressure: 'with it, my pressure high, without it, it's pressure high' - Expresses concern about potential negative effects of antihypertensive medication on organ function - Prefers to focus on lifestyle modifications for blood pressure management  Diabetes mellitus type 2 - Currently taking metformin  for glycemic control - Last hemoglobin A1c was 8.4, above target range - Recent ophthalmologic examination showed no evidence of diabetes-related ocular complications - Expresses greater concern about diabetes management compared to hypertension  Lifestyle and physical activity - Sedentary lifestyle; previously walked frequently when living in [place]  - Current physical activity limited by safety concerns and lack of sidewalks in current neighborhood  Dietary habits - Eats when hungry and sleeps when tired - Diet includes vegetables, fruits, and occasional fried fish - Avoids added sugars - Uses Mediterranean salt in diet      Patient Active Problem List   Diagnosis Date Noted   Colon cancer screening declined 02/28/2022   Preventative health care 11/17/2021   OSA (obstructive sleep apnea) 11/15/2021    Hyperlipidemia associated with type 2 diabetes mellitus (HCC) 05/03/2021   Annual physical exam 05/03/2021   Tinea pedis of both feet 05/03/2021   Impacted cerumen of both ears 05/03/2021   Hypertension 01/19/2021   Uncontrolled type 2 diabetes mellitus with hyperglycemia (HCC) 01/19/2021   Screening for colon cancer 01/19/2021   Morbid obesity (HCC) 01/19/2021    ROS    Objective:     BP (!) 189/100   Pulse 84   Ht 5' 6 (1.676 m)   Wt 256 lb (116.1 kg)   SpO2 94%   BMI 41.32 kg/m  BP Readings from Last 3 Encounters:  10/24/23 (!) 189/100  07/25/23 132/84  04/23/23 (!) 177/78   Wt Readings from Last 3 Encounters:  10/24/23 256 lb (116.1 kg)  07/24/23 256 lb 1.9 oz (116.2 kg)  04/23/23 258 lb (117 kg)     Physical Exam Vitals and nursing note reviewed.  Constitutional:      Appearance: Normal appearance. He is obese.  HENT:     Head: Normocephalic.  Eyes:     Extraocular Movements: Extraocular movements intact.     Pupils: Pupils are equal, round, and reactive to light.  Cardiovascular:     Rate and Rhythm: Normal rate and regular rhythm.  Pulmonary:     Effort: Pulmonary effort is normal.     Breath sounds: Normal breath sounds.  Musculoskeletal:     Cervical back: Normal range of motion and neck supple.  Neurological:     Mental Status: He is alert and oriented to person, place, and time.  Psychiatric:        Mood and Affect: Mood normal.  Thought Content: Thought content normal.     Last CBC Lab Results  Component Value Date   WBC 4.8 04/23/2023   HGB 13.2 04/23/2023   HCT 39.0 04/23/2023   MCV 91 04/23/2023   MCH 30.8 04/23/2023   RDW 12.5 04/23/2023   PLT 214 04/23/2023   Last metabolic panel Lab Results  Component Value Date   GLUCOSE 281 (H) 10/24/2023   NA 139 10/24/2023   K 4.3 10/24/2023   CL 102 10/24/2023   CO2 21 10/24/2023   BUN 11 10/24/2023   CREATININE 1.18 10/24/2023   EGFR 66 10/24/2023   CALCIUM  10.2 10/24/2023    PROT 7.4 10/24/2023   ALBUMIN 4.7 10/24/2023   LABGLOB 2.7 10/24/2023   AGRATIO 1.8 01/17/2022   BILITOT 0.4 10/24/2023   ALKPHOS 79 10/24/2023   AST 19 10/24/2023   ALT 64 (H) 10/24/2023   ANIONGAP 9 12/17/2020   Last lipids Lab Results  Component Value Date   CHOL 222 (H) 10/24/2023   HDL 35 (L) 10/24/2023   LDLCALC 161 (H) 10/24/2023   TRIG 143 10/24/2023   CHOLHDL 6.3 (H) 10/24/2023   Last hemoglobin A1c Lab Results  Component Value Date   HGBA1C 9.9 (H) 10/24/2023   Last thyroid functions Lab Results  Component Value Date   TSH 2.790 04/23/2023   Last vitamin D  Lab Results  Component Value Date   VD25OH 35.3 04/23/2023   Last vitamin B12 and Folate No results found for: VITAMINB12, FOLATE    The 10-year ASCVD risk score (Arnett DK, et al., 2019) is: 43.8%    Assessment & Plan:   Problem List Items Addressed This Visit       Cardiovascular and Mediastinum   Hypertension   Relevant Orders   CMP14+EGFR (Completed)     Endocrine   Uncontrolled type 2 diabetes mellitus with hyperglycemia (HCC) - Primary   Relevant Orders   CMP14+EGFR (Completed)   HgB A1c (Completed)   Urine Microalbumin w/creat. ratio (Completed)   Hyperlipidemia associated with type 2 diabetes mellitus (HCC)   Relevant Orders   CMP14+EGFR (Completed)   Lipid Profile (Completed)     Other   Morbid obesity (HCC)    No follow-ups on file.    Leita Longs, FNP

## 2023-10-26 LAB — MICROALBUMIN / CREATININE URINE RATIO

## 2023-10-27 LAB — CMP14+EGFR
ALT: 64 [IU]/L — ABNORMAL HIGH (ref 0–44)
AST: 19 [IU]/L (ref 0–40)
Albumin: 4.7 g/dL (ref 3.8–4.8)
Alkaline Phosphatase: 79 [IU]/L (ref 47–123)
BUN/Creatinine Ratio: 9 — ABNORMAL LOW (ref 10–24)
BUN: 11 mg/dL (ref 8–27)
Bilirubin Total: 0.4 mg/dL (ref 0.0–1.2)
CO2: 21 mmol/L (ref 20–29)
Calcium: 10.2 mg/dL (ref 8.6–10.2)
Chloride: 102 mmol/L (ref 96–106)
Creatinine, Ser: 1.18 mg/dL (ref 0.76–1.27)
Globulin, Total: 2.7 g/dL (ref 1.5–4.5)
Glucose: 281 mg/dL — ABNORMAL HIGH (ref 70–99)
Potassium: 4.3 mmol/L (ref 3.5–5.2)
Sodium: 139 mmol/L (ref 134–144)
Total Protein: 7.4 g/dL (ref 6.0–8.5)
eGFR: 66 mL/min/{1.73_m2}

## 2023-10-27 LAB — LIPID PANEL
Chol/HDL Ratio: 6.3 ratio — ABNORMAL HIGH (ref 0.0–5.0)
Cholesterol, Total: 222 mg/dL — ABNORMAL HIGH (ref 100–199)
HDL: 35 mg/dL — ABNORMAL LOW (ref >39)
LDL Chol Calc (NIH): 161 mg/dL — ABNORMAL HIGH (ref 0–99)
Triglycerides: 143 mg/dL (ref 0–149)
VLDL Cholesterol Cal: 26 mg/dL (ref 5–40)

## 2023-10-27 LAB — HEMOGLOBIN A1C
Est. average glucose Bld gHb Est-mCnc: 237 mg/dL
Hgb A1c MFr Bld: 9.9 % — ABNORMAL HIGH (ref 4.8–5.6)

## 2023-10-27 LAB — MICROALBUMIN / CREATININE URINE RATIO

## 2023-10-28 ENCOUNTER — Ambulatory Visit: Payer: Self-pay

## 2023-10-28 NOTE — Assessment & Plan Note (Signed)
 Discussed lifestyle modifications, including physical activity and a healthy diet.  Aim for 150 minutes of exercise per week.

## 2023-10-28 NOTE — Assessment & Plan Note (Signed)
 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 Blood pressure remains elevated at 170/94 mmHg. Discussed risks of uncontrolled hypertension, including stroke, heart attack, and kidney damage. - Encourage consistent use of antihypertensive medications. - Emphasize risks of uncontrolled hypertension and benefits of medication adherence.

## 2023-10-28 NOTE — Assessment & Plan Note (Signed)
 Type 2 diabetes mellitus with recent A1c of 8.4%, showing improvement. On metformin  and Jardiance , aware of dietary considerations. - Recheck A1c today. - Continue metformin  and Jardiance . - Ensure medication refills are up to date.

## 2023-10-28 NOTE — Assessment & Plan Note (Signed)
 Severe OSA.  Reports adherence to CPAP use, benefiting both sleep apnea and hypertension management. - Continue CPAP therapy.

## 2023-10-28 NOTE — Assessment & Plan Note (Signed)
He is currently prescribed atorvastatin 80 mg daily.  Repeat lipid panel ordered today.

## 2023-12-09 ENCOUNTER — Other Ambulatory Visit: Payer: Self-pay | Admitting: Internal Medicine

## 2024-01-10 ENCOUNTER — Other Ambulatory Visit: Payer: Self-pay | Admitting: Internal Medicine

## 2024-01-10 DIAGNOSIS — E1165 Type 2 diabetes mellitus with hyperglycemia: Secondary | ICD-10-CM

## 2024-01-17 ENCOUNTER — Telehealth: Payer: Self-pay

## 2024-01-17 NOTE — Telephone Encounter (Signed)
 Received fax from [facility] requesting office notes that support patient's need for CPAP machine.   Faxed to [place] at [phone]. Awaiting fax confirmation

## 2024-01-22 ENCOUNTER — Other Ambulatory Visit: Payer: Self-pay | Admitting: Internal Medicine

## 2024-02-27 ENCOUNTER — Ambulatory Visit: Payer: Self-pay

## 2024-03-31 ENCOUNTER — Ambulatory Visit: Payer: Medicare Other

## 2024-06-09 ENCOUNTER — Ambulatory Visit: Payer: Self-pay
